# Patient Record
Sex: Female | Born: 1950 | Race: White | Hispanic: No | Marital: Married | Smoking: Never smoker
Health system: Southern US, Community
[De-identification: ages and names within clinical notes are randomized; demographics above are authoritative.]

## PROBLEM LIST (undated history)

## (undated) DIAGNOSIS — I10 Essential (primary) hypertension: Secondary | ICD-10-CM

## (undated) DIAGNOSIS — I493 Ventricular premature depolarization: Secondary | ICD-10-CM

## (undated) DIAGNOSIS — E6609 Other obesity due to excess calories: Secondary | ICD-10-CM

## (undated) DIAGNOSIS — E7800 Pure hypercholesterolemia, unspecified: Secondary | ICD-10-CM

## (undated) HISTORY — PX: BREAST EXCISIONAL BIOPSY: SUR124

## (undated) HISTORY — PX: OTHER SURGICAL HISTORY: SHX169

## (undated) HISTORY — DX: Pure hypercholesterolemia, unspecified: E78.00

## (undated) HISTORY — DX: Essential (primary) hypertension: I10

## (undated) HISTORY — DX: Other obesity due to excess calories: E66.09

## (undated) HISTORY — DX: Ventricular premature depolarization: I49.3

---

## 1999-12-01 ENCOUNTER — Encounter: Admission: RE | Admit: 1999-12-01 | Discharge: 1999-12-01 | Payer: Self-pay | Admitting: Family Medicine

## 1999-12-01 ENCOUNTER — Encounter: Payer: Self-pay | Admitting: Family Medicine

## 2002-06-24 ENCOUNTER — Encounter: Admission: RE | Admit: 2002-06-24 | Discharge: 2002-06-24 | Payer: Self-pay | Admitting: Cardiology

## 2002-06-24 ENCOUNTER — Encounter: Payer: Self-pay | Admitting: Cardiology

## 2002-10-24 ENCOUNTER — Emergency Department (HOSPITAL_COMMUNITY): Admission: EM | Admit: 2002-10-24 | Discharge: 2002-10-24 | Payer: Self-pay | Admitting: Emergency Medicine

## 2003-01-12 ENCOUNTER — Emergency Department (HOSPITAL_COMMUNITY): Admission: EM | Admit: 2003-01-12 | Discharge: 2003-01-12 | Payer: Self-pay | Admitting: Emergency Medicine

## 2003-01-12 ENCOUNTER — Encounter: Payer: Self-pay | Admitting: Emergency Medicine

## 2003-08-19 ENCOUNTER — Emergency Department (HOSPITAL_COMMUNITY): Admission: EM | Admit: 2003-08-19 | Discharge: 2003-08-19 | Payer: Self-pay | Admitting: Internal Medicine

## 2003-11-14 ENCOUNTER — Encounter: Admission: RE | Admit: 2003-11-14 | Discharge: 2003-11-14 | Payer: Self-pay | Admitting: Family Medicine

## 2006-04-28 ENCOUNTER — Other Ambulatory Visit: Admission: RE | Admit: 2006-04-28 | Discharge: 2006-04-28 | Payer: Self-pay | Admitting: Gynecology

## 2006-08-04 ENCOUNTER — Encounter: Admission: RE | Admit: 2006-08-04 | Discharge: 2006-08-04 | Payer: Self-pay | Admitting: Gynecology

## 2008-07-26 ENCOUNTER — Emergency Department (HOSPITAL_COMMUNITY): Admission: EM | Admit: 2008-07-26 | Discharge: 2008-07-26 | Payer: Self-pay | Admitting: Emergency Medicine

## 2009-03-27 ENCOUNTER — Ambulatory Visit: Payer: Self-pay | Admitting: Gynecology

## 2009-05-08 ENCOUNTER — Encounter: Admission: RE | Admit: 2009-05-08 | Discharge: 2009-05-08 | Payer: Self-pay | Admitting: Cardiology

## 2010-04-25 ENCOUNTER — Encounter: Admission: RE | Admit: 2010-04-25 | Discharge: 2010-04-25 | Payer: Self-pay | Admitting: Family Medicine

## 2010-04-26 ENCOUNTER — Encounter: Admission: RE | Admit: 2010-04-26 | Discharge: 2010-04-26 | Payer: Self-pay | Admitting: Family Medicine

## 2010-04-29 ENCOUNTER — Encounter: Admission: RE | Admit: 2010-04-29 | Discharge: 2010-04-29 | Payer: Self-pay | Admitting: Family Medicine

## 2010-06-18 ENCOUNTER — Encounter: Admission: RE | Admit: 2010-06-18 | Discharge: 2010-06-18 | Payer: Self-pay | Admitting: Cardiology

## 2010-10-05 ENCOUNTER — Ambulatory Visit
Admission: RE | Admit: 2010-10-05 | Discharge: 2010-10-05 | Payer: Self-pay | Source: Home / Self Care | Attending: Gynecology | Admitting: Gynecology

## 2010-10-05 ENCOUNTER — Other Ambulatory Visit
Admission: RE | Admit: 2010-10-05 | Discharge: 2010-10-05 | Payer: Self-pay | Source: Home / Self Care | Admitting: Gynecology

## 2010-10-15 ENCOUNTER — Ambulatory Visit
Admission: RE | Admit: 2010-10-15 | Discharge: 2010-10-15 | Payer: Self-pay | Source: Home / Self Care | Attending: Gynecology | Admitting: Gynecology

## 2010-10-17 ENCOUNTER — Encounter: Payer: Self-pay | Admitting: Family Medicine

## 2010-10-22 ENCOUNTER — Other Ambulatory Visit: Payer: Self-pay | Admitting: Gynecology

## 2010-10-22 DIAGNOSIS — N6310 Unspecified lump in the right breast, unspecified quadrant: Secondary | ICD-10-CM

## 2010-11-12 ENCOUNTER — Ambulatory Visit
Admission: RE | Admit: 2010-11-12 | Discharge: 2010-11-12 | Disposition: A | Discharge status: Home / Self Care | Payer: BC Managed Care – PPO | Source: Ambulatory Visit | Attending: Gynecology | Admitting: Gynecology

## 2010-11-12 DIAGNOSIS — N6310 Unspecified lump in the right breast, unspecified quadrant: Secondary | ICD-10-CM

## 2010-12-23 ENCOUNTER — Telehealth: Payer: Self-pay | Admitting: Cardiology

## 2010-12-23 NOTE — Telephone Encounter (Signed)
PT'S HUSBAND CALLED, PT'S TOPROLOL HAS NO MORE REFILLS. HE IS WANTING TO GO TO THE PHARMACY ASAP. SHE IS DOWN TO ONE PILL.

## 2010-12-24 ENCOUNTER — Other Ambulatory Visit: Payer: Self-pay | Admitting: *Deleted

## 2010-12-24 DIAGNOSIS — I10 Essential (primary) hypertension: Secondary | ICD-10-CM

## 2010-12-24 MED ORDER — AMLODIPINE BESYLATE 5 MG PO TABS: 5.0000 mg | ORAL_TABLET | Freq: Every day | ORAL | Status: DC

## 2010-12-24 MED ORDER — METOPROLOL SUCCINATE ER 100 MG PO TB24: ORAL_TABLET | ORAL | Status: DC

## 2010-12-24 NOTE — Telephone Encounter (Signed)
Refilled meds per fax request.  

## 2011-01-14 ENCOUNTER — Encounter: Payer: Self-pay | Admitting: Cardiology

## 2011-01-14 ENCOUNTER — Other Ambulatory Visit: Payer: Self-pay | Admitting: *Deleted

## 2011-01-14 DIAGNOSIS — E6609 Other obesity due to excess calories: Secondary | ICD-10-CM | POA: Insufficient documentation

## 2011-01-14 DIAGNOSIS — E785 Hyperlipidemia, unspecified: Secondary | ICD-10-CM | POA: Insufficient documentation

## 2011-01-14 DIAGNOSIS — E7800 Pure hypercholesterolemia, unspecified: Secondary | ICD-10-CM

## 2011-01-14 DIAGNOSIS — I493 Ventricular premature depolarization: Secondary | ICD-10-CM

## 2011-01-14 DIAGNOSIS — I10 Essential (primary) hypertension: Secondary | ICD-10-CM | POA: Insufficient documentation

## 2011-01-18 ENCOUNTER — Other Ambulatory Visit: Payer: BC Managed Care – PPO | Admitting: *Deleted

## 2011-01-18 ENCOUNTER — Ambulatory Visit: Payer: BC Managed Care – PPO | Admitting: Cardiology

## 2011-03-09 ENCOUNTER — Other Ambulatory Visit (INDEPENDENT_AMBULATORY_CARE_PROVIDER_SITE_OTHER): Payer: BC Managed Care – PPO | Admitting: *Deleted

## 2011-03-09 ENCOUNTER — Ambulatory Visit (INDEPENDENT_AMBULATORY_CARE_PROVIDER_SITE_OTHER): Payer: BC Managed Care – PPO | Admitting: Cardiology

## 2011-03-09 ENCOUNTER — Other Ambulatory Visit: Payer: Self-pay | Admitting: Cardiology

## 2011-03-09 ENCOUNTER — Encounter: Payer: Self-pay | Admitting: Cardiology

## 2011-03-09 DIAGNOSIS — E7800 Pure hypercholesterolemia, unspecified: Secondary | ICD-10-CM

## 2011-03-09 DIAGNOSIS — I10 Essential (primary) hypertension: Secondary | ICD-10-CM

## 2011-03-09 LAB — BASIC METABOLIC PANEL
BUN: 17 mg/dL (ref 6–23)
CO2: 29 mEq/L (ref 19–32)
Calcium: 8.9 mg/dL (ref 8.4–10.5)
GFR: 108.29 mL/min (ref >60.00)
Sodium: 142 mEq/L (ref 135–145)

## 2011-03-09 LAB — HEPATIC FUNCTION PANEL
Alkaline Phosphatase: 63 U/L (ref 39–117)
Total Bilirubin: 0.9 mg/dL (ref 0.3–1.2)
Total Protein: 7.3 g/dL (ref 6.0–8.3)

## 2011-03-09 LAB — LIPID PANEL: Total CHOL/HDL Ratio: 5

## 2011-03-09 NOTE — Progress Notes (Signed)
[name] Date of Birth:  Jan 24, 1951 [facility] [address]   [address] [place], [place]  [address] [phone]           Fax   [phone]  History of Present Illness: This pleasant 60 year old woman is seen for a followup office visit.  We last saw her in July 2010 to at that time she weighed 224 pounds.Her present weight of 217 is an improvement.  She has not been getting any regular exercise.  She has been having a lot of problems with arthritis.  She is unable to take any of the nonsteroidal anti-inflammatories because they cause her hemorrhoids to bleed.  He is able to take Tylenol.  Patient has occasional chest discomfort and occasional PVCs.  She had a normal nuclear stress test 08/08/07.  Current Outpatient Prescriptions  Medication Sig Dispense Refill  . Acetaminophen (TYLENOL PO) Take 100 mg by mouth. Taking 2 daily       . amLODipine (NORVASC) 5 MG tablet Take 1 tablet (5 mg total) by mouth daily.  30 tablet  1  . doxycycline (DORYX) 100 MG DR capsule Take up to 3 daily       . metoprolol (TOPROL XL) 100 MG 24 hr tablet 1/2 bid  45 tablet  1  . Multiple Vitamin (MULTIVITAMIN) tablet Take 1 tablet by mouth daily.        Marland Kitchen DISCONTD: MetroNIDAZOLE (FLAGYL PO) Take by mouth 2 (two) times daily.          Allergies  Allergen Reactions  . Amoxicillin     itching  . Diovan (Valsartan)     weakness  . Hctz (Hydrochlorothiazide)     weakness  . Penicillins   . Zithromax (Azithromycin)     Patient Active Problem List  Diagnoses  . PVC's (premature ventricular contractions)  . HTN (hypertension)  . Exogenous obesity  . Hypercholesteremia    History  Smoking status  . Never Smoker   Smokeless tobacco  . Not on file    History  Alcohol Use No    Family History  Problem Relation Age of Onset  . Hypertension    . Coronary artery disease    . Diabetes      Review of Systems: Constitutional: no fever chills diaphoresis or  fatigue or change in weight.  Head and neck: no hearing loss, no epistaxis, no photophobia or visual disturbance. Respiratory: No cough, shortness of breath or wheezing. Cardiovascular: No chest pain peripheral edema, palpitations. Gastrointestinal: No abdominal distention, no abdominal pain, no change in bowel habits hematochezia or melena. Genitourinary: No dysuria, no frequency, no urgency, no nocturia. Musculoskeletal:Significant for lots of arthritis and bone cysts. Neurological: No dizziness, no headaches, no numbness, no seizures, no syncope, no weakness, no tremors. Hematologic: No lymphadenopathy, no easy bruising. Psychiatric: No confusion, no hallucinations, no sleep disturbance.    Physical Exam: Filed Vitals:   03/09/11 0902  BP: 150/90  Pulse: 68  The general appearance reveals a well-developed well-nourished woman in no distress.The head and neck exam reveals pupils equal and reactive.  Extraocular movements are full.  There is no scleral icterus.  The mouth and pharynx are normal.  The neck is supple.  The carotids reveal no bruits.  The jugular venous pressure is normal.  The  thyroid is not enlarged.  There is no lymphadenopathy.  The chest is clear to percussion and auscultation.  There are no rales or rhonchi.  Expansion of the  chest is symmetrical.  The precordium is quiet.  The first heart sound is normal.  The second heart sound is physiologically split.  There is no murmur gallop rub or click.  There is no abnormal lift or heave.The breasts reveal no masses.  The abdomen is soft and nontender.  The bowel sounds are normal.  The liver and spleen are not enlarged.  There are no abdominal masses.  There are no abdominal bruits.  Extremities reveal good pedal pulses.  There is no phlebitis or edema.  There is no cyanosis or clubbing.  Strength is normal and symmetrical in all extremities.  There is no lateralizing weakness.  There are no sensory deficits.  The skin is warm and  dry.  There is no rash.   Assessment / Plan: Await results of today's blood work then consider statin therapy.  Continue weight loss program.  Recheck in6 months over the Christmas holiday preferably for followup office visit and lab work.  She will monitor her blood pressure at home and if it remains elevated she will contact us.

## 2011-03-09 NOTE — Assessment & Plan Note (Signed)
The patient is seen today after a two-year absence.  She has a past history of essential hypertension.  He has not been expressing any headaches or dizziness she denies any exertional chest pain.  She does not have any history of ischemic heart disease and she had a normal nuclear stress test 08/08/07.  She does acknowledge that she gets nervous driving to [facility] in the heavy traffic and it makes her blood pressure go up.

## 2011-03-09 NOTE — Assessment & Plan Note (Addendum)
Patient has a past history of hypercholesterolemia.  Since last visit she has Lost weight.  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  Blood work drawn today is pending.  Consider statin therapy if lipids remain elevated.

## 2011-03-10 NOTE — Progress Notes (Signed)
Lm for patient to call

## 2011-03-22 IMAGING — US US BREAST R
1 series · 4 of 4 positions shown · non-contrast
Comparison: Prior studies

CLINICAL DATA: The patient noted a small superficial palpable
nodule located medially and inferiorly within the right breast in a
parasternal location.  A small amount of material drained from this
lesion and this decreased in size.

DIGITAL DIAGNOSTIC RIGHT BREAST MAMMOGRAM WITH CAD AND RIGHT BREAST
ULTRASOUND:

[Series 1: us breast right · 4 of 4 slices shown]
[im 1/4]
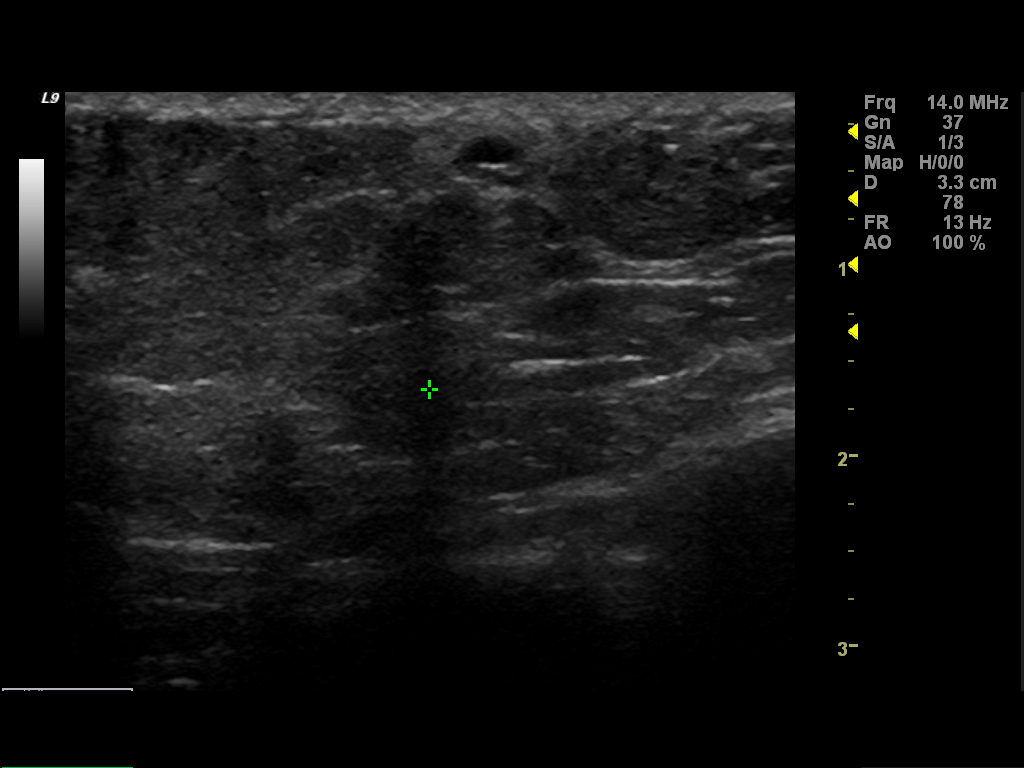
[im 2/4]
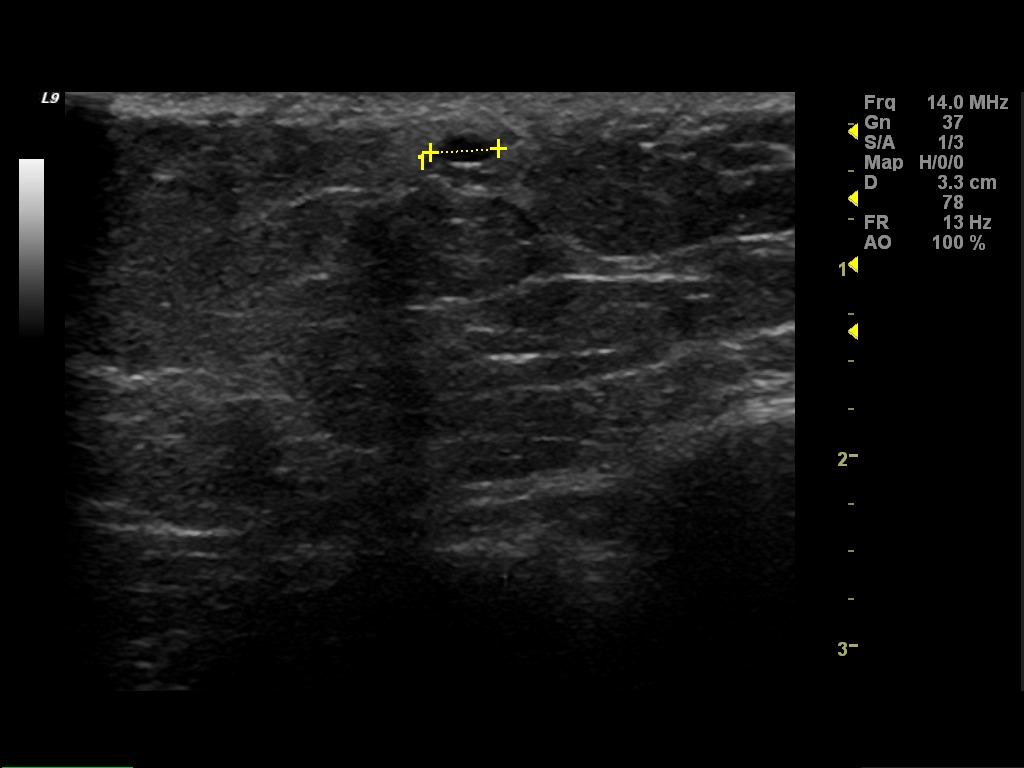
[im 3/4]
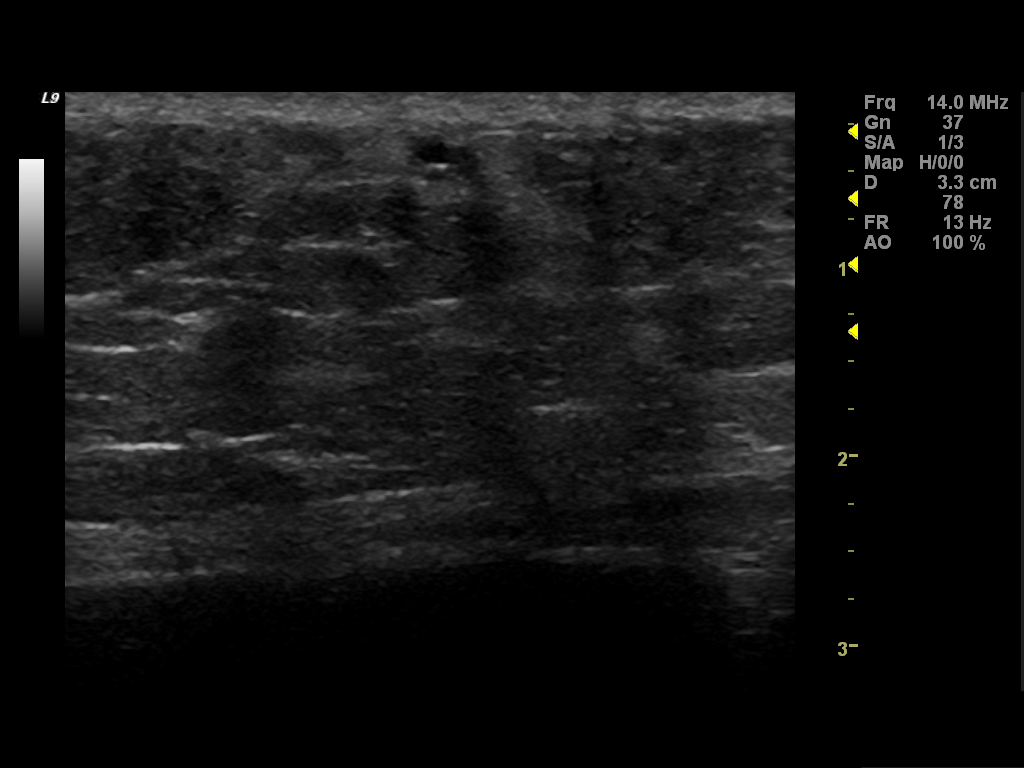
[im 4/4]
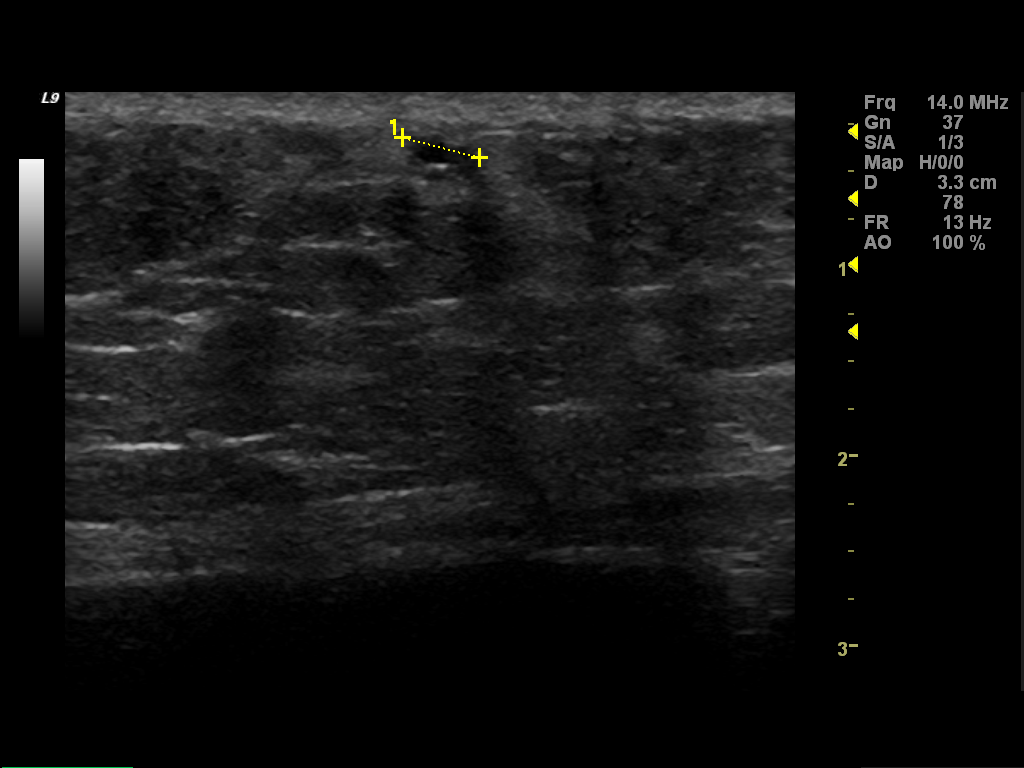

[4 of 4 positions shown; findings below may reference images not displayed]

FINDINGS: There is a fibrofatty parenchymal pattern present which
is stable.  In the area of questionable palpable nodularity, there
is predominately fatty tissue.  There is no mass or distortion.
Mammographic images were processed with CAD.

On physical exam, there is no discrete palpable abnormality present
on today's evaluation.

Ultrasound is performed, showing a small subdermal cyst located
within the right breast at the 4 o'clock position in a parasternal
location.  This measures 4 mm in size.  Most likely this represents
a sebaceous cyst which has spontaneously decompressed.  There is no
mass, distortion, or worrisome shadowing.
IMPRESSION: A small (4 mm) probable sebaceous cyst located within the right
breast at the 4 o'clock position in a parasternal location.  No
findings worrisome for malignancy.  Recommend screening mammography
in May 2011.

BI-RADS CATEGORY 2:  Benign finding(s).

## 2011-03-25 ENCOUNTER — Telehealth: Payer: Self-pay | Admitting: *Deleted

## 2011-03-25 DIAGNOSIS — I10 Essential (primary) hypertension: Secondary | ICD-10-CM

## 2011-03-25 MED ORDER — METOPROLOL SUCCINATE ER 100 MG PO TB24: ORAL_TABLET | ORAL | Status: DC

## 2011-03-25 MED ORDER — AMLODIPINE BESYLATE 5 MG PO TABS: 5.0000 mg | ORAL_TABLET | Freq: Every day | ORAL | Status: DC

## 2011-03-25 NOTE — Progress Notes (Signed)
Refuses to start any statins

## 2011-03-25 NOTE — Telephone Encounter (Signed)
Patient phone back regarding lab results.  Patient refuses to start any statin drugs.  Explained that this will put her at higher risk, still refuses.

## 2011-03-25 NOTE — Telephone Encounter (Signed)
Message copied by Burnell Blanks on Fri Mar 25, 2011 10:43 AM ------      Message from: Cassell Clement      Created: Thu Mar 10, 2011  9:30 AM       Please report.  Her lipids are still elevated.  The LDL is 158.The total cholesterol is 219 and triglycerides are 176 with HDL of 43.The kidney and liver function tests are normal.I would like her to add a statin in the form of Crestor 10 mg daily.  Give her samples or coupons if available.  Return for fasting lipid panel and hepatic function panel and basal metabolic panel in 2 months

## 2011-03-25 NOTE — Telephone Encounter (Signed)
Noted. TAB

## 2011-07-07 ENCOUNTER — Encounter (HOSPITAL_COMMUNITY): Payer: Self-pay | Admitting: Radiology

## 2011-07-07 ENCOUNTER — Emergency Department (HOSPITAL_COMMUNITY): Payer: BC Managed Care – PPO

## 2011-07-07 ENCOUNTER — Inpatient Hospital Stay (HOSPITAL_COMMUNITY)
Admission: EM | Admit: 2011-07-07 | Discharge: 2011-07-12 | DRG: 157 | Disposition: A | Discharge status: Home / Self Care | Payer: BC Managed Care – PPO | Attending: Surgery | Admitting: Surgery

## 2011-07-07 DIAGNOSIS — I10 Essential (primary) hypertension: Secondary | ICD-10-CM | POA: Diagnosis present

## 2011-07-07 DIAGNOSIS — Q430 Meckel's diverticulum (displaced) (hypertrophic): Principal | ICD-10-CM

## 2011-07-07 DIAGNOSIS — Z886 Allergy status to analgesic agent status: Secondary | ICD-10-CM

## 2011-07-07 DIAGNOSIS — E669 Obesity, unspecified: Secondary | ICD-10-CM | POA: Diagnosis present

## 2011-07-07 DIAGNOSIS — Z880 Allergy status to penicillin: Secondary | ICD-10-CM

## 2011-07-07 DIAGNOSIS — K630 Abscess of intestine: Secondary | ICD-10-CM | POA: Diagnosis present

## 2011-07-07 DIAGNOSIS — Z888 Allergy status to other drugs, medicaments and biological substances status: Secondary | ICD-10-CM

## 2011-07-07 LAB — COMPREHENSIVE METABOLIC PANEL
ALT: 14 U/L (ref 0–35)
BUN: 13 mg/dL (ref 6–23)
CO2: 28 mEq/L (ref 19–32)
Creatinine, Ser: 0.54 mg/dL (ref 0.50–1.10)
Potassium: 3.2 mEq/L — ABNORMAL LOW (ref 3.5–5.1)
Sodium: 137 mEq/L (ref 135–145)
Total Protein: 7 g/dL (ref 6.0–8.3)

## 2011-07-07 LAB — CBC
HCT: 41.1 % (ref 36.0–46.0)
MCH: 29.4 pg (ref 26.0–34.0)
MCHC: 33.6 g/dL (ref 30.0–36.0)
RBC: 4.7 MIL/uL (ref 3.87–5.11)
RDW: 12.6 % (ref 11.5–15.5)
WBC: 12.9 10*3/uL — ABNORMAL HIGH (ref 4.0–10.5)

## 2011-07-07 LAB — DIFFERENTIAL
Basophils Absolute: 0 10*3/uL (ref 0.0–0.1)
Eosinophils Absolute: 0.1 10*3/uL (ref 0.0–0.7)
Eosinophils Relative: 1 % (ref 0–5)
Lymphocytes Relative: 19 % (ref 12–46)
Monocytes Absolute: 1 10*3/uL (ref 0.1–1.0)
Monocytes Relative: 8 % (ref 3–12)
Neutro Abs: 9.3 10*3/uL — ABNORMAL HIGH (ref 1.7–7.7)

## 2011-07-07 LAB — URINE MICROSCOPIC-ADD ON

## 2011-07-07 LAB — URINALYSIS, ROUTINE W REFLEX MICROSCOPIC
Glucose, UA: NEGATIVE mg/dL
Urobilinogen, UA: 0.2 mg/dL (ref 0.0–1.0)

## 2011-07-07 MED ORDER — IOHEXOL 300 MG/ML  SOLN
100.0000 mL | Freq: Once | INTRAMUSCULAR | Status: AC | PRN
  Administered 2011-07-07: 100 mL via INTRAVENOUS

## 2011-07-08 ENCOUNTER — Other Ambulatory Visit (INDEPENDENT_AMBULATORY_CARE_PROVIDER_SITE_OTHER): Payer: Self-pay | Admitting: General Surgery

## 2011-07-08 DIAGNOSIS — K631 Perforation of intestine (nontraumatic): Secondary | ICD-10-CM

## 2011-07-08 DIAGNOSIS — Q430 Meckel's diverticulum (displaced) (hypertrophic): Secondary | ICD-10-CM

## 2011-07-08 HISTORY — PX: COLON SURGERY: SHX602

## 2011-07-08 HISTORY — PX: OTHER SURGICAL HISTORY: SHX169

## 2011-07-08 LAB — URINE CULTURE
Culture  Setup Time: 201210120131
Culture: NO GROWTH

## 2011-07-09 LAB — CBC
HCT: 37.2 % (ref 36.0–46.0)
MCHC: 33.3 g/dL (ref 30.0–36.0)
RDW: 12.6 % (ref 11.5–15.5)

## 2011-07-12 NOTE — Op Note (Signed)
[name], [name]                 ACCOUNT NO.:  [id number]  MEDICAL RECORD NO.:  [id number]  LOCATION:  5020                         FACILITY:  [facility]  PHYSICIAN:  Gabrielle Dare. Janee Morn, M.D.DATE OF BIRTH:  1950-11-23  DATE OF PROCEDURE:  07/08/2011 DATE OF DISCHARGE:                              OPERATIVE REPORT   PREOPERATIVE DIAGNOSIS:  Small bowel perforation with intraabdominal abscess.  POSTOPERATIVE DIAGNOSIS:  Perforated Meckel diverticulum with intraabdominal abscess.  PROCEDURE:  Laparoscopic resection of perforated Meckel diverticulum and drainage of intraabdominal abscess.  SURGEON:  Gabrielle Dare. Janee Morn, MD  ANESTHESIA:  General endotracheal.  HISTORY OF PRESENT ILLNESS:  [name] is a 60 year old white female with history of hypertension who developed acute onset of very severe abdominal pain 48 hours ago.  She continued without pain until the following day when she saw her primary care physician at [facility], they did some x-rays.  The patient was called back earlier the following day on July 07, 2011, and referred to the emergency department.  CT scan of the abdomen and pelvis was done at that time demonstrating a perforated small bowel with thickening consistent with either a primary perforation of the small bowel or perforated Meckel diverticulum.  The patient has significant tenderness and she was taken emergently to the operating room.  PROCEDURE IN DETAIL:  Informed consent was obtained.  The patient was identified in the preop holding area.  She was received intravenous antibiotics.  She was brought to the operating room.  General endotracheal anesthesia was administered by the Anesthesia staff. Nursing staff placed a Foley catheter.  A time-out procedure was done. The abdomen was prepped and draped in sterile fashion.  The infraumbilical region was infiltrated with 0.25% Marcaine with epinephrine.  Infraumbilical incision was made.   Subcutaneous tissues were dissected down revealing the anterior fascia.  This was divided sharply along the midline and the peritoneal cavity was entered under direct vision without difficulty.  A 0 Vicryl pursestring suture was placed around the fascial opening.  The Hasson trocar was inserted into the abdomen.  The abdomen was insufflated with carbon dioxide in standard fashion.  Under direct vision, a 5-mm right midabdomen port and a 5-mm left midabdomen port were placed.  Local anesthetic was used at each port site.  The laparoscopic exploration began at the cecum, the appendix was normal.  There was minimal cloudy fluid in the abdomen. The small bowel was run back for several centimeters finally reaching back to an area of inflammation almost just beneath the umbilicus. Careful dissection revealed that this was a perforated Meckel diverticulum that was walled off by another piece of small bowel and some mesentery trapping as a little localized abscess, this was all gently teased apart fully exposing and freeing up the Meckel diverticulum.  The tip of it was intact and not inflamed.  The just past the base and the half of the body was extremely inflamed with one small perforation.  There was a small rim of viable tissue along the connection to the ileum.  Once this was totally freed up, an Endo-GIA with reticulating stapler with a double row of 3 alliance with staples,  found perfused.  The Meckel diverticulum was pulled up and this was fired across the base going down slightly on the small bowel to ensure excellent closure.  The diverticulum was placed in an EndoCatch bag and removed from the abdomen via the infraumbilical port site.  The staple line was then closely inspected and watched.  There tiny bit of bleeding that was cauterized.  Staple line was completely intact.  The small bowel was milked prograde and retrograde.  There was no leakage, then the area appeared viable.  The  abdomen was then copiously irrigated with multiple liters of warm saline.  The irrigation fluid returned clear. Staple line was rechecked and remained completely intact.  The area where the abscess had been was then thoroughly irrigated out.  A 19- [place] Blake drain was placed down and had little abscess of that area and brought out through the left lower quadrant port site and secured with nylon suture.  The remainder of the irrigation fluid was evacuated and it was clear.  The two remaining ports were removed under direct vision, and the pneumoperitoneum was released.  The infraumbilical fascia was closed by tying the 0 Vicryl purse-string with care not to trap any intraabdominal contents.  The two wounds were copiously irrigated and skin of each was closed with running 4-0 Monocryl subcuticular stitch followed by Dermabond.  All sponge, needle and instrument counts were correct.  The patient tolerated the procedure well without apparent complication and was taken the recovery room in stable condition.     Gabrielle Dare Janee Morn, M.D.     BET/MEDQ  D:  07/08/2011  T:  07/08/2011  Job:  [id number]  cc:   Cassell Clement, M.D.  Electronically Signed by Violeta Gelinas M.D. on 07/12/2011 01:32:12 PM

## 2011-07-18 ENCOUNTER — Encounter (INDEPENDENT_AMBULATORY_CARE_PROVIDER_SITE_OTHER): Payer: Self-pay

## 2011-07-20 ENCOUNTER — Ambulatory Visit (INDEPENDENT_AMBULATORY_CARE_PROVIDER_SITE_OTHER): Payer: BC Managed Care – PPO | Admitting: General Surgery

## 2011-07-20 ENCOUNTER — Encounter (INDEPENDENT_AMBULATORY_CARE_PROVIDER_SITE_OTHER): Payer: Self-pay | Admitting: General Surgery

## 2011-07-20 VITALS — BP 136/84 | HR 72 | Temp 97.4°F | Resp 20 | Ht 67.5 in | Wt 213.5 lb

## 2011-07-20 DIAGNOSIS — Q430 Meckel's diverticulum (displaced) (hypertrophic): Secondary | ICD-10-CM

## 2011-07-20 NOTE — Progress Notes (Signed)
Subjective:     Patient ID: [name], female   DOB: 1951/08/25, 60 y.o.   MRN: [id number]  HPI Patient presents status post laparoscopic resection of perforated Meckel's diverticulum with intra-abdominal abscess fluid this was done on July 08, 2011. She was discharged home with a JP drain. The output has gradually tapered down to less than 30 cc per day. She is eating and moving her bowels. She is not taking any pain medication.  Review of Systems     Objective:   Physical Exam    Abdomen soft and nontender. Incisions are clean dry and intact. Bowel sounds are active.  JP. drain was removed without difficulty. A small gauze was placed at that site. Assessment:    Doing well status post laparoscopic resection of perforated Meckel's diverticulum and drainage of intra-abdominal abscess    Plan:     Upon heavy lifting for a total of 6 weeks after surgery. She is cleared to return to work starting September 05, 2011. Wound care instructions were given. We'll see her back as needed.She will finish her antibiotics from hospital.

## 2011-07-26 NOTE — Discharge Summary (Signed)
  [name], [name]                 ACCOUNT NO.:  [id number]  MEDICAL RECORD NO.:  [id number]  LOCATION:  5020                         FACILITY:  [facility]  PHYSICIAN:  Velora Heckler, MD      DATE OF BIRTH:  Nov 22, 1950  DATE OF ADMISSION:  07/07/2011 DATE OF DISCHARGE:  07/12/2011                              DISCHARGE SUMMARY   DISCHARGE DIAGNOSES: 1. Perforated Meckel diverticulum with abscess formation. 2. Hypertension. 3. Obesity.  CONSULTANTS:  None.  PROCEDURES:  Laparoscopic resection of perforated Meckel diverticulum with drainage of abdominal abscess by Dr. Janee Morn.  HISTORY OF PRESENT ILLNESS:  This is a 60 year old white female who came in complaining of increasing abdominal pain over 48 hours.  She was seen at [facility] and x-ray was taken.  She was directed to the emergency department where a CT showed a small bowel perforation with abscess.  She was taken to the operating room for the above- mentioned surgery and then transferred to the floor for further care.  HOSPITAL COURSE:  The patient did pretty well in the hospital.  Her ileus resolved quickly and her diet was able to be advanced.  She had had a couple of bowel movements and was tolerating regular diet prior to Discharge.  Her pain was controlled mostly on Tylenol.  She was mobilizing well and was able to be discharged in good condition in the care of her husband.  Her drain continued to put out a fair amount of fluid and so was left in at the time of discharge.  DISCHARGE MEDICATIONS: 1. Tylenol 650 mg p.o. q.4 hours p.r.n. pain. 2. Norco 5/325 take 1 p.o. q.4 hours p.r.n. more severe pain #20 with     no refill. 3. Cipro 500 mg take 1 by mouth twice daily, #12 no refill. 4. Metronidazole 500 mg to take 1 by mouth every 8 hours, #24 with no     refill.  This will give her a full 10 day course on her     antibiotics.  In addition, she should resume her Norvasc 5 mg by     mouth daily at  bedtime and Toprol-XL 50 mg by mouth twice daily.     She should stop the doxycycline that she was on.  She has had a     here to the new schedule for the Tylenol.  FOLLOWUP:  The patient will need to follow up with Dr. Janee Morn in the clinic and we will call the office for an appointment.     [name], P.A.   ______________________________ Velora Heckler, MD    MJ/MEDQ  D:  07/12/2011  T:  07/12/2011  Job:  [id number]  Electronically Signed by Charma Igo P.A. on 07/18/2011 02:15:36 PM Electronically Signed by Darnell Level MD on 07/26/2011 11:08:51 AM

## 2011-10-10 ENCOUNTER — Telehealth: Payer: Self-pay | Admitting: Cardiology

## 2011-10-10 ENCOUNTER — Encounter: Payer: Self-pay | Admitting: Cardiology

## 2011-10-10 NOTE — Telephone Encounter (Signed)
New Problem:    Patient called in today wanting to speak with you because she claims that something came up at her job and she needs a letter saying that she is physically unable to work.  Please call back after 4pm.

## 2011-10-10 NOTE — Telephone Encounter (Signed)
Has to be able to physically lift at least 50 lbs and she can not do it.  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 Will forward to  Dr. Patty Sermons for reveiw  Fax [phone] attn: Jillene Bucks

## 2011-10-10 NOTE — Telephone Encounter (Signed)
Pt calling back re message left earlier, requesting rtn call back today

## 2011-10-11 ENCOUNTER — Encounter: Payer: Self-pay | Admitting: *Deleted

## 2011-10-11 NOTE — Telephone Encounter (Signed)
Advised will mail letter

## 2012-01-26 ENCOUNTER — Telehealth (INDEPENDENT_AMBULATORY_CARE_PROVIDER_SITE_OTHER): Payer: Self-pay

## 2012-01-26 ENCOUNTER — Encounter (INDEPENDENT_AMBULATORY_CARE_PROVIDER_SITE_OTHER): Payer: BC Managed Care – PPO | Admitting: General Surgery

## 2012-01-26 NOTE — Telephone Encounter (Signed)
Patient spouse called in to report his wife has been experiencing epigastric pain, urine has a foul odor, increase in gas.  Patient denies fevers, nausea or vomiting, bm's are regular, patient has appetite.  [name] reports this has been going on for atleast 2 weeks.  Dr. Janee Morn paged for review.

## 2012-01-26 NOTE — Telephone Encounter (Signed)
Reviewed with Dr. Janee Morn--  patient need's to follow up with her PCP for further evaluation of her urine odor and her GI physician for follow up and further workup.  It does not seem to be a surgical issue at this time.  Mr. Holte will call and schedule appointments with PCP & GI, call our office if he has questions or concerns.

## 2012-04-06 ENCOUNTER — Ambulatory Visit (INDEPENDENT_AMBULATORY_CARE_PROVIDER_SITE_OTHER): Payer: BC Managed Care – PPO | Admitting: Cardiology

## 2012-04-06 ENCOUNTER — Encounter: Payer: Self-pay | Admitting: Cardiology

## 2012-04-06 VITALS — BP 148/90 | HR 80 | Ht 67.0 in | Wt 223.0 lb

## 2012-04-06 DIAGNOSIS — I493 Ventricular premature depolarization: Secondary | ICD-10-CM

## 2012-04-06 DIAGNOSIS — R5381 Other malaise: Secondary | ICD-10-CM

## 2012-04-06 DIAGNOSIS — I10 Essential (primary) hypertension: Secondary | ICD-10-CM

## 2012-04-06 DIAGNOSIS — R5383 Other fatigue: Secondary | ICD-10-CM | POA: Insufficient documentation

## 2012-04-06 DIAGNOSIS — I4949 Other premature depolarization: Secondary | ICD-10-CM

## 2012-04-06 MED ORDER — AMLODIPINE BESYLATE 10 MG PO TABS: 10.0000 mg | ORAL_TABLET | Freq: Every day | ORAL | Status: DC

## 2012-04-06 NOTE — Assessment & Plan Note (Signed)
Patient continues to complain of lack of energy.  This has been a chronic problem with her.  She has been checked for thyroid and for anemia etc. and nothing has turned up.  Patient has gained 6 pounds since last visit.  Her overweight status may be significant contributory factor to her lack of energy and we talked about that today.  She is going to try to avoid carbohydrates and starches and white things.

## 2012-04-06 NOTE — Assessment & Plan Note (Signed)
The patient has a past history of PVCs.  She has not been aware of any PVCs recently.

## 2012-04-06 NOTE — Progress Notes (Signed)
[name] Date of Birth:  28-Jan-1951 [facility] [address] [place], [place]  [address] [phone]         Fax   [phone]  History of Present Illness: This pleasant 61 year old woman is seen after a one-year absence.  She has a past history of essential hypertension and exogenous obesity.  We last saw her in June 2012.  She reports that in October 2012 she developed severe epigastric pain and was hospitalized emergently and underwent emergency surgery by Dr. Violeta Gelinas for a Meckel's diverticulum.  She was hospitalized for 5 days and she was out of work for 2 and half months.  She is still feeling tired and exhausted.  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  Current Outpatient Prescriptions  Medication Sig Dispense Refill  . Acetaminophen (TYLENOL PO) Take 100 mg by mouth. Taking 2 daily       . amLODipine (NORVASC) 10 MG tablet Take 1 tablet (10 mg total) by mouth daily.  90 tablet  3  . metoprolol (TOPROL XL) 100 MG 24 hr tablet 1/2 bid  45 tablet  11  . DISCONTD: amLODipine (NORVASC) 5 MG tablet Take 5 mg by mouth daily.      Marland Kitchen DISCONTD: amLODipine (NORVASC) 5 MG tablet Take 1 tablet (5 mg total) by mouth daily.  30 tablet  11    Allergies  Allergen Reactions  . Amoxicillin     itching  . Diovan (Valsartan)     weakness  . Hctz (Hydrochlorothiazide)     weakness  . Penicillins   . Sulfa Antibiotics     itching  . Zithromax (Azithromycin)     Patient Active Problem List  Diagnosis  . PVC's (premature ventricular contractions)  . HTN (hypertension)  . Exogenous obesity  . Hypercholesteremia    History  Smoking status  . Never Smoker   Smokeless tobacco  . Never Used    History  Alcohol Use No    Family History  Problem  Relation Age of Onset  . Hypertension    . Coronary artery disease    . Diabetes      Review of Systems: Constitutional: no fever chills diaphoresis or fatigue or change in weight.  Head and neck: no hearing loss, no epistaxis, no photophobia or visual disturbance. Respiratory: No cough, shortness of breath or wheezing. Cardiovascular: No chest pain peripheral edema, palpitations. Gastrointestinal: No abdominal distention, no abdominal pain, no change in bowel habits hematochezia or melena. Genitourinary: No dysuria, no frequency, no urgency, no nocturia. Musculoskeletal:No arthralgias, no back pain, no gait disturbance or myalgias. Neurological: No dizziness, no headaches, no numbness, no seizures, no syncope, no weakness, no tremors. Hematologic: No lymphadenopathy, no easy bruising. Psychiatric: No confusion, no hallucinations, no sleep disturbance.    Physical Exam: Filed Vitals:   04/06/12 0947  BP: 148/90  Pulse: 80   dental appearance reveals a well-developed well-nourished woman in no distress.  I rechecked her blood pressure and got 150/90.The head and neck exam reveals pupils equal and reactive.  Extraocular movements are full.  There is no scleral icterus.  The mouth and pharynx are normal.  The neck is supple.  The carotids reveal no bruits.  The jugular venous pressure is normal.  The  thyroid is not enlarged.  There is no lymphadenopathy.  The chest is clear to percussion and auscultation.  There are no rales or rhonchi.  Expansion of the chest is symmetrical.  The precordium is quiet.  The first heart sound is normal.  The second heart sound is physiologically split.  There is no murmur gallop rub or click.  There is no abnormal lift or heave.  The abdomen is soft and nontender.  The bowel sounds are normal.  The liver and spleen are not enlarged.  There are no abdominal masses.  There are no abdominal bruits.  Extremities reveal good pedal pulses.  There is no phlebitis or  edema.  There is no cyanosis or clubbing.  Strength is normal and symmetrical in all extremities.  There is no lateralizing weakness.  There are no sensory deficits.  The skin is warm and dry.  There is no rash.     Assessment / Plan: The patient is to continue same medication except increase amlodipine to 10 mg daily for better blood pressure control.  She needs to work harder on weight loss through dietary means.  Sugar rechecked in 6 months for followup office visit EKG and fasting lipid panel hepatic function panel and basal metabolic panel

## 2012-04-06 NOTE — Assessment & Plan Note (Signed)
The patient has a long history of essential hypertension.  Her blood pressure continues to run high.  She is intolerant of ACE inhibitors and of hydrochlorothiazide.  She is presently on amlodipine and metoprolol.  Her blood pressure remains high in the range of 150/90.

## 2012-04-06 NOTE — Patient Instructions (Addendum)
Increase your Amlodipine to 10 mg daily (double your 5 mg tablet until finished and a new Rx has been sent to [facility] for the 10 mg tablet and you will only take 1 tablet)  Your physician wants you to follow-up in: 6 months with fasting labs (lp/bmet/hfp)  You will receive a reminder letter in the mail two months in advance. If you don't receive a letter, please call our office to schedule the follow-up appointment.

## 2012-04-13 ENCOUNTER — Other Ambulatory Visit: Payer: Self-pay | Admitting: Gynecology

## 2012-04-13 DIAGNOSIS — Z1231 Encounter for screening mammogram for malignant neoplasm of breast: Secondary | ICD-10-CM

## 2012-04-18 ENCOUNTER — Ambulatory Visit
Admission: RE | Admit: 2012-04-18 | Discharge: 2012-04-18 | Disposition: A | Discharge status: Home / Self Care | Payer: BC Managed Care – PPO | Source: Ambulatory Visit | Attending: Gynecology | Admitting: Gynecology

## 2012-04-18 ENCOUNTER — Ambulatory Visit: Payer: BC Managed Care – PPO

## 2012-04-18 DIAGNOSIS — Z1231 Encounter for screening mammogram for malignant neoplasm of breast: Secondary | ICD-10-CM

## 2012-04-23 ENCOUNTER — Other Ambulatory Visit: Payer: Self-pay | Admitting: Cardiology

## 2012-11-28 ENCOUNTER — Other Ambulatory Visit: Payer: Self-pay | Admitting: *Deleted

## 2012-11-28 MED ORDER — METOPROLOL SUCCINATE ER 100 MG PO TB24: ORAL_TABLET | ORAL | Status: DC

## 2012-12-25 ENCOUNTER — Telehealth: Payer: Self-pay

## 2012-12-25 DIAGNOSIS — I10 Essential (primary) hypertension: Secondary | ICD-10-CM

## 2012-12-25 MED ORDER — AMLODIPINE BESYLATE 10 MG PO TABS: 10.0000 mg | ORAL_TABLET | Freq: Every day | ORAL | Status: DC

## 2012-12-25 MED ORDER — METOPROLOL SUCCINATE ER 100 MG PO TB24: ORAL_TABLET | ORAL | Status: DC

## 2012-12-25 NOTE — Telephone Encounter (Signed)
Pt called to rqst refill  For metoprolol and amplodipine. 90 tab R-0. Pt has upcoming appt for 5/14

## 2013-02-01 ENCOUNTER — Ambulatory Visit (INDEPENDENT_AMBULATORY_CARE_PROVIDER_SITE_OTHER): Payer: BC Managed Care – PPO | Admitting: Cardiology

## 2013-02-01 ENCOUNTER — Encounter: Payer: Self-pay | Admitting: Cardiology

## 2013-02-01 VITALS — BP 137/73 | HR 72 | Ht 67.5 in | Wt 226.8 lb

## 2013-02-01 DIAGNOSIS — I10 Essential (primary) hypertension: Secondary | ICD-10-CM

## 2013-02-01 DIAGNOSIS — I119 Hypertensive heart disease without heart failure: Secondary | ICD-10-CM

## 2013-02-01 DIAGNOSIS — I4949 Other premature depolarization: Secondary | ICD-10-CM

## 2013-02-01 DIAGNOSIS — E6609 Other obesity due to excess calories: Secondary | ICD-10-CM

## 2013-02-01 DIAGNOSIS — E669 Obesity, unspecified: Secondary | ICD-10-CM

## 2013-02-01 DIAGNOSIS — I493 Ventricular premature depolarization: Secondary | ICD-10-CM

## 2013-02-01 DIAGNOSIS — E7800 Pure hypercholesterolemia, unspecified: Secondary | ICD-10-CM

## 2013-02-01 LAB — LIPID PANEL
HDL: 35.7 mg/dL — ABNORMAL LOW (ref >39.00)
LDL Cholesterol: 121 mg/dL — ABNORMAL HIGH (ref 0–99)
Total CHOL/HDL Ratio: 5
Triglycerides: 153 mg/dL — ABNORMAL HIGH (ref 0.0–149.0)

## 2013-02-01 LAB — BASIC METABOLIC PANEL WITH GFR
BUN: 13 mg/dL (ref 6–23)
CO2: 28 meq/L (ref 19–32)
Calcium: 9.1 mg/dL (ref 8.4–10.5)
Chloride: 102 meq/L (ref 96–112)
Creatinine, Ser: 0.6 mg/dL (ref 0.4–1.2)
GFR: 105.58 mL/min
Glucose, Bld: 97 mg/dL (ref 70–99)
Potassium: 4.1 meq/L (ref 3.5–5.1)
Sodium: 140 meq/L (ref 135–145)

## 2013-02-01 LAB — HEPATIC FUNCTION PANEL
AST: 21 U/L (ref 0–37)
Albumin: 4.3 g/dL (ref 3.5–5.2)
Total Bilirubin: 0.7 mg/dL (ref 0.3–1.2)

## 2013-02-01 MED ORDER — AMLODIPINE BESYLATE 5 MG PO TABS: 5.0000 mg | ORAL_TABLET | Freq: Every day | ORAL | Status: DC

## 2013-02-01 NOTE — Assessment & Plan Note (Signed)
The patient is only able to tolerate 5 mg a amlodipine.  At the 10 mg dose she experiences too much dizziness

## 2013-02-01 NOTE — Assessment & Plan Note (Signed)
The patient still has occasional awareness of palpitations.  EKG today shows no PVCs

## 2013-02-01 NOTE — Progress Notes (Signed)
[name] Date of Birth:  02-11-1951 [facility] [address] [place], [place]  [address] [phone]         Fax   [phone]  History of Present Illness: This pleasant 62 year old woman is seen after a one-year absence. She has a past history of essential hypertension and exogenous obesity. We last saw her in July 2013.  In October 2012 she developed severe epigastric pain and was hospitalized emergently and underwent emergency surgery by Dr. Violeta Gelinas for a Meckel's diverticulum. She was hospitalized for 5 days and she was out of work for 2 and half months.  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  She anticipates teaching for at least 1 more year.  She has taught for almost 30 years.  Classroom teaching however is becoming more stressful because of poor discipline in a classroom.  Current Outpatient Prescriptions  Medication Sig Dispense Refill  . Acetaminophen (TYLENOL PO) Take 100 mg by mouth. Taking 2 daily       . amLODipine (NORVASC) 5 MG tablet Take 1 tablet (5 mg total) by mouth daily.  90 tablet  0  . metoprolol succinate (TOPROL XL) 100 MG 24 hr tablet Take 1/2 tablet twice a day  90 tablet  0   No current facility-administered medications for this visit.    Allergies  Allergen Reactions  . Amoxicillin     itching  . Diovan (Valsartan)     weakness  . Hctz (Hydrochlorothiazide)     weakness  . Penicillins   . Sulfa Antibiotics     itching  . Zithromax (Azithromycin)     Patient Active Problem List   Diagnosis Date Noted  . Malaise and fatigue 04/06/2012  . PVC's (premature ventricular contractions)   . HTN (hypertension)   . Exogenous obesity   . Hypercholesteremia     History  Smoking status  . Never Smoker   Smokeless tobacco  . Never Used    History  Alcohol Use No    Family History  Problem Relation Age of Onset  . Hypertension    . Coronary artery disease    . Diabetes       Review of Systems: Constitutional: no fever chills diaphoresis or fatigue or change in weight.  Head and neck: no hearing loss, no epistaxis, no photophobia or visual disturbance. Respiratory: No cough, shortness of breath or wheezing. Cardiovascular: No chest pain peripheral edema, palpitations. Gastrointestinal: No abdominal distention, no abdominal pain, no change in bowel habits hematochezia or melena. Genitourinary: No dysuria, no frequency, no urgency, no nocturia. Musculoskeletal:No arthralgias, no back pain, no gait disturbance or myalgias. Neurological: No dizziness, no headaches, no numbness, no seizures, no syncope, no weakness, no tremors. Hematologic: No lymphadenopathy, no easy bruising. Psychiatric: No confusion, no hallucinations, no sleep disturbance.    Physical Exam: Filed Vitals:   02/01/13 1405  BP: 137/73  Pulse: 72   general appearance reveals a well-developed well-nourished woman in no distress.The head and neck exam reveals pupils equal and reactive.  Extraocular movements are full.  There is no scleral icterus.  The mouth and pharynx are normal.  The neck is supple.  The carotids reveal no bruits.  The jugular venous pressure is normal.  The  thyroid is not enlarged.  There is no lymphadenopathy.  The chest is clear to percussion and auscultation.  There are no rales or rhonchi.  Expansion of the chest is symmetrical.  The precordium  is quiet.  The first heart sound is normal.  The second heart sound is physiologically split.  There is no murmur gallop rub or click.  There is no abnormal lift or heave.  The abdomen is soft and nontender.  The bowel sounds are normal.  The liver and spleen are not enlarged.  There are no abdominal masses.  There are no abdominal bruits.  Extremities reveal good pedal pulses.  There is no phlebitis or edema.  There is no cyanosis or clubbing.  Strength is normal and symmetrical in all extremities.  There is no lateralizing weakness.   There are no sensory deficits.  The skin is warm and dry.  There is no rash.  EKG shows normal sinus rhythm and no ischemic changes   Assessment / Plan: Continue same medication.  Continue on amlodipine 5 mg daily.  Recheck in 6 months for followup office visit lipid panel hepatic function panel and basal metabolic panel.

## 2013-02-01 NOTE — Assessment & Plan Note (Signed)
The patient has a history of hypercholesterolemia.  In the past she has declined statin drugs.  Her husband had bad experiences with statins causing muscle aches etc. we are checking lab work today

## 2013-02-01 NOTE — Assessment & Plan Note (Signed)
The patient has intended to lose weight but unfortunately her weight is up 3 pounds since last visit.  I have encouraged her to try to lose weight.

## 2013-02-01 NOTE — Patient Instructions (Signed)
Will obtain labs today and call you with the results (LP/HFP/BMET)  CONTINUE THE NORVASC 5 MG LIKE YOU HAVE BEEN TAKING, NEW RX SENT TO PHARMACY  Your physician wants you to follow-up in: 6 months with fasting labs (lp/bmet/hfp)  You will receive a reminder letter in the mail two months in advance. If you don't receive a letter, please call our office to schedule the follow-up appointment.

## 2013-02-03 NOTE — Progress Notes (Signed)
Quick Note:  Please report to patient. The recent labs are stable. Continue same medication and careful diet. Potassium is normal. Lipids are better. ______

## 2013-03-08 ENCOUNTER — Encounter: Payer: Self-pay | Admitting: Cardiology

## 2013-03-11 ENCOUNTER — Encounter: Payer: Self-pay | Admitting: Cardiology

## 2013-03-26 ENCOUNTER — Other Ambulatory Visit: Payer: Self-pay | Admitting: Cardiology

## 2013-05-07 ENCOUNTER — Other Ambulatory Visit: Payer: Self-pay

## 2013-05-07 DIAGNOSIS — Z1231 Encounter for screening mammogram for malignant neoplasm of breast: Secondary | ICD-10-CM

## 2013-05-16 ENCOUNTER — Ambulatory Visit: Payer: BC Managed Care – PPO

## 2013-06-07 ENCOUNTER — Ambulatory Visit
Admission: RE | Admit: 2013-06-07 | Discharge: 2013-06-07 | Disposition: A | Discharge status: Home / Self Care | Payer: BC Managed Care – PPO | Source: Ambulatory Visit

## 2013-06-07 DIAGNOSIS — Z1231 Encounter for screening mammogram for malignant neoplasm of breast: Secondary | ICD-10-CM

## 2013-09-23 ENCOUNTER — Other Ambulatory Visit: Payer: Self-pay | Admitting: Cardiology

## 2013-12-30 ENCOUNTER — Other Ambulatory Visit: Payer: Self-pay | Admitting: Cardiology

## 2014-04-03 ENCOUNTER — Other Ambulatory Visit: Payer: Self-pay | Admitting: Cardiology

## 2014-06-24 ENCOUNTER — Telehealth: Payer: Self-pay | Admitting: Cardiology

## 2014-06-24 NOTE — Telephone Encounter (Signed)
Patient called ask about blood pressure and lab work from last visit.  Informed patient about her last BP, blood glucose, and cholesterol. Informed patient that it appears she was supposed to come back about a year ago for follow-up labs. Patient st she would look at her schedule and call the office to schedule appointment and lab work.

## 2014-06-24 NOTE — Telephone Encounter (Signed)
New message      Need information regarding her cholesterol

## 2014-07-01 ENCOUNTER — Ambulatory Visit (INDEPENDENT_AMBULATORY_CARE_PROVIDER_SITE_OTHER): Payer: BC Managed Care – PPO | Admitting: Gynecology

## 2014-07-01 ENCOUNTER — Encounter: Payer: Self-pay | Admitting: Gynecology

## 2014-07-01 DIAGNOSIS — L02439 Carbuncle of limb, unspecified: Secondary | ICD-10-CM

## 2014-07-01 DIAGNOSIS — N898 Other specified noninflammatory disorders of vagina: Secondary | ICD-10-CM

## 2014-07-01 DIAGNOSIS — L02429 Furuncle of limb, unspecified: Secondary | ICD-10-CM

## 2014-07-01 DIAGNOSIS — R102 Pelvic and perineal pain: Secondary | ICD-10-CM

## 2014-07-01 LAB — WET PREP FOR TRICH, YEAST, CLUE
CLUE CELLS WET PREP: NONE SEEN
TRICH WET PREP: NONE SEEN

## 2014-07-01 LAB — URINALYSIS W MICROSCOPIC + REFLEX CULTURE
Bilirubin Urine: NEGATIVE
Casts: NONE SEEN
Crystals: NONE SEEN
Glucose, UA: NEGATIVE mg/dL
Ketones, ur: NEGATIVE mg/dL
LEUKOCYTES UA: NEGATIVE
NITRITE: NEGATIVE
PROTEIN: NEGATIVE mg/dL
Specific Gravity, Urine: <1.005 — ABNORMAL LOW (ref 1.005–1.030)
Urobilinogen, UA: 0.2 mg/dL (ref 0.0–1.0)
WBC UA: NONE SEEN WBC/hpf (ref <3)
pH: 5 (ref 5.0–8.0)

## 2014-07-01 MED ORDER — METRONIDAZOLE 500 MG PO TABS: 500.0000 mg | ORAL_TABLET | Freq: Two times a day (BID) | ORAL | Status: DC

## 2014-07-01 MED ORDER — FLUCONAZOLE 150 MG PO TABS: 150.0000 mg | ORAL_TABLET | Freq: Once | ORAL | Status: DC

## 2014-07-01 NOTE — Addendum Note (Signed)
Addended by: Dayna BarkerGARDNER, Marguerette Sheller K on: 07/01/2014 12:47 PM   Modules accepted: Orders

## 2014-07-01 NOTE — Patient Instructions (Signed)
Take the Diflucan pill once for the yeast infection. Take the Flagyl (metronidazole) medication twice daily for 7 days for the bacterial infection. Avoid alcohol while taking. Put warm soaks on the boil and that should help it come to ahead and drain. Follow up if this gets worse or persists.  Follow up for your annual exam as scheduled.

## 2014-07-01 NOTE — Progress Notes (Addendum)
Mikayla LothWanda G Mason [birth year] [id number]        63 y.o.  Patient has not been in the office for a number of years presents complaining of vaginal discharge and irritation of the last several weeks. Also a boil on her right thigh that has been present for a week and seems to be getting better. It has not drained. No urinary symptoms such as frequency dysuria or urgency. Status post hysterectomy in the past.  Past medical history,surgical history, problem list, medications, allergies, family history and social history were all reviewed and documented in the EPIC chart.  Directed ROS with pertinent positives and negatives documented in the history of present illness/assessment and plan.  Exam: [name] General appearance:  Normal Upper inner right thigh with classic small 2 cm boil pointing but not draining. No surrounding cellulitis. Abdomen soft nontender without masses guarding rebound organomegaly Pelvic external BUS vagina with frothy white discharge. Vaginal mucosa erythematous. Bimanual without masses or tenderness rectal exam is normal  Assessment/Plan:  63 y.o. with:  1. Vaginal discharge and irritation. Wet prep shows some yeast. Clinically it appears bacterial. Will cover both with Diflucan 150 mg x1 dose and Flagyl 500 mg twice a day x7 days, alcohol avoidance reviewed. Follow up if symptoms persist, worsen or recur. 2. Right upper inner thigh boil. Historically seems to be getting better. It is fluctuant but not draining. Recommended warm soaks to the area. Follow up if persists or worsens. Patient is way overdue for annual exam and she knows it and has it scheduled in November. She is in the process of arranging for her mammography now.     Dara LordsFONTAINE,Katlynne Mckercher P MD, 12:37 PM 07/01/2014

## 2014-07-02 LAB — URINE CULTURE
COLONY COUNT: NO GROWTH
ORGANISM ID, BACTERIA: NO GROWTH

## 2014-08-19 ENCOUNTER — Ambulatory Visit: Payer: Self-pay | Admitting: Gynecology

## 2014-10-08 ENCOUNTER — Other Ambulatory Visit: Payer: Self-pay | Admitting: Cardiology

## 2014-10-21 ENCOUNTER — Other Ambulatory Visit: Payer: Self-pay

## 2014-10-21 MED ORDER — METOPROLOL SUCCINATE ER 100 MG PO TB24: ORAL_TABLET | ORAL | Status: DC

## 2014-10-31 ENCOUNTER — Telehealth: Payer: Self-pay | Admitting: *Deleted

## 2014-10-31 NOTE — Telephone Encounter (Signed)
Pt called to follow up from OV on 07/01/14 the boil has reappeared, pt though the Rx for Diflucan 150 mg x1 dose and Flagyl 500 mg twice a day x7 days was to treat the boil. I explained to pt that per TF note she was to follow up if this should happen. Pt will do warm soaks to area as directed on OV note. Pt transferred to front desk to schedule next week.

## 2014-11-05 ENCOUNTER — Encounter: Payer: Self-pay | Admitting: Gynecology

## 2014-11-05 ENCOUNTER — Ambulatory Visit (INDEPENDENT_AMBULATORY_CARE_PROVIDER_SITE_OTHER): Payer: BC Managed Care – PPO | Admitting: Gynecology

## 2014-11-05 DIAGNOSIS — M545 Low back pain: Secondary | ICD-10-CM

## 2014-11-05 DIAGNOSIS — N764 Abscess of vulva: Secondary | ICD-10-CM

## 2014-11-05 LAB — URINALYSIS W MICROSCOPIC + REFLEX CULTURE
Bilirubin Urine: NEGATIVE
CASTS: NONE SEEN
Crystals: NONE SEEN
Glucose, UA: NEGATIVE mg/dL
KETONES UR: NEGATIVE mg/dL
NITRITE: NEGATIVE
PH: 5 (ref 5.0–8.0)
Protein, ur: NEGATIVE mg/dL
Specific Gravity, Urine: 1.01 (ref 1.005–1.030)
Urobilinogen, UA: 0.2 mg/dL (ref 0.0–1.0)

## 2014-11-05 NOTE — Progress Notes (Signed)
Mikayla LothWanda G Mason 06/16/1951 [id number]        64 y.o.  Presents with several issues. The first is several days ago she developed a boil in her right upper labia majora/lower mons region. She applied heat and it ultimately drained. She's had a previous episode of this. Also notes some low back pain and wonders whether she is getting a urinary tract infection. She's not having any frequency dysuria urgency or lower abdominal discomfort. Does have a history of low back pain secondary to arthritis.no fever chills nausea vomiting diarrhea constipation.  Lastly patient is complaining of vaginal dryness is a chronic issue. Is not sexually active and is not having an issue with dyspareunia.  Past medical history,surgical history, problem list, medications, allergies, family history and social history were all reviewed and documented in the EPIC chart.  Directed ROS with pertinent positives and negatives documented in the history of present illness/assessment and plan.  Exam: Kim assistant General appearance:  Normal Spine straight with point tenderness left paraspinal region. No muscle spasm or palpable abnormalities. This is see region where she notes chronic pain. Abdomen soft nontender without masses guarding rebound organomegaly. Pelvic external BUS vagina with generalized atrophic changes. Healing small boil right upper labia majora/lower mons. No evidence of cellulitis or regional adenopathy. Bimanual exam without masses or tenderness  Assessment/Plan:  64 y.o. with low back pain which I think is orthopedic.  Urinalysis does show rare bacteria and will check culture to be sure but I recommended she follow up with her orthopedic surgeon. Small healing right labial boil. Recommend observation. If recurrent apply heat early to see if it does not help drain. If it persists then follow up for evaluation.  Lastly we discussed her vaginal dryness. Options to include OTC vaginal moisturizers reviewed. Vaginal  estrogen supplementation also discussed the patient's very leery of this and would prefer to try the OTC products. She'll follow up if this continues to be a problem.     Dara LordsFONTAINE,Snigdha Howser P MD, 10:10 AM 11/05/2014

## 2014-11-05 NOTE — Patient Instructions (Signed)
Office will call you if the urine culture grows out any bacteria.  Try an over-the-counter vaginal moisturizer such as Replens to help with the vaginal dryness.

## 2014-11-05 NOTE — Addendum Note (Signed)
Addended by: Dayna BarkerGARDNER, Audyn Dimercurio K on: 11/05/2014 12:22 PM   Modules accepted: Orders

## 2014-11-07 LAB — URINE CULTURE
Colony Count: NO GROWTH
Organism ID, Bacteria: NO GROWTH

## 2014-11-27 ENCOUNTER — Encounter: Payer: Self-pay | Admitting: Cardiology

## 2014-11-27 ENCOUNTER — Ambulatory Visit (INDEPENDENT_AMBULATORY_CARE_PROVIDER_SITE_OTHER): Payer: BC Managed Care – PPO | Admitting: Cardiology

## 2014-11-27 VITALS — BP 130/76 | HR 77 | Ht 67.5 in | Wt 239.6 lb

## 2014-11-27 DIAGNOSIS — I493 Ventricular premature depolarization: Secondary | ICD-10-CM

## 2014-11-27 DIAGNOSIS — E7800 Pure hypercholesterolemia, unspecified: Secondary | ICD-10-CM

## 2014-11-27 DIAGNOSIS — I119 Hypertensive heart disease without heart failure: Secondary | ICD-10-CM

## 2014-11-27 LAB — HEPATIC FUNCTION PANEL
ALT: 25 U/L (ref 0–35)
AST: 18 U/L (ref 0–37)
Albumin: 4.1 g/dL (ref 3.5–5.2)
Alkaline Phosphatase: 65 U/L (ref 39–117)
BILIRUBIN DIRECT: 0.1 mg/dL (ref 0.0–0.3)
BILIRUBIN TOTAL: 0.7 mg/dL (ref 0.2–1.2)
Total Protein: 7.1 g/dL (ref 6.0–8.3)

## 2014-11-27 LAB — BASIC METABOLIC PANEL
BUN: 15 mg/dL (ref 6–23)
CHLORIDE: 105 meq/L (ref 96–112)
CO2: 31 mEq/L (ref 19–32)
Calcium: 9.3 mg/dL (ref 8.4–10.5)
Creatinine, Ser: 0.62 mg/dL (ref 0.40–1.20)
GFR: 103.01 mL/min (ref >60.00)
Glucose, Bld: 98 mg/dL (ref 70–99)
POTASSIUM: 4 meq/L (ref 3.5–5.1)
Sodium: 139 mEq/L (ref 135–145)

## 2014-11-27 LAB — LIPID PANEL
CHOL/HDL RATIO: 4
Cholesterol: 164 mg/dL (ref 0–200)
HDL: 38.1 mg/dL — ABNORMAL LOW (ref >39.00)
LDL Cholesterol: 91 mg/dL (ref 0–99)
NonHDL: 125.9
TRIGLYCERIDES: 173 mg/dL — AB (ref 0.0–149.0)
VLDL: 34.6 mg/dL (ref 0.0–40.0)

## 2014-11-27 NOTE — Patient Instructions (Addendum)
Will obtain labs today and call you with the results (lp/bmet/hfp)I  ncrease your walking and work harder on weight loss  Your physician wants you to follow-up in: 1 year ov/ekg  You will receive a reminder letter in the mail two months in advance. If you don't receive a letter, please call our office to schedule the follow-up appointment.

## 2014-11-27 NOTE — Progress Notes (Signed)
Cardiology Office Note   Date:  11/27/2014   ID:  [name], DOB 07/21/1951, MRN [id number]  PCP:  Pamelia HoitWILSON,FRED HENRY, MD  Cardiologist:   Cassell Clementhomas Oceanna Arruda, MD   No chief complaint on file.     History of Present Illness: Mikayla LothWanda G Fifer is a 64 y.o. female who presents for a one-year follow-up office visit.  This pleasant 64 year old woman is seen after a one-year absence. She has a past history of essential hypertension and exogenous obesity.  She has a history of benign PVCs.  We last saw her in 2014.. In October 2012 she developed severe epigastric pain and was hospitalized emergently and underwent emergency surgery by Dr. Violeta GelinasBurke Thompson for a Meckel's diverticulum. She was hospitalized for 5 days and she was out of work for 2 and half months. 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 The patient has not been expressing any chest pain.  She notes occasional palpitations.  She's had no dizziness or syncope.  She has not been getting much regular exercise.  She has not been as careful with her diet.  Her weight is up 13 pounds since last visit. Her family history reveals that her mother is alive at age 64 has atrial fibrillation.  Her father died of acute alcohol poisoning at age 64.  Past Medical History  Diagnosis Date  . PVC's (premature ventricular contractions)   . HTN (hypertension)   . Exogenous obesity   . Hypercholesteremia     Past Surgical History  Procedure Laterality Date  . Hysterectomy - unknown type    . Lap resection of perforated diverticulum  07/08/2011    drainage of intraabdominal abscess  . Colon surgery  07/08/11    emergency colon surgery      Current Outpatient Prescriptions  Medication Sig Dispense Refill  . Acetaminophen (TYLENOL PO) Take 100 mg by mouth. Taking 2 daily     . amLODipine (NORVASC) 5 MG tablet Take 5 mg by mouth as directed. 1/2 tablet daily    . doxycycline (VIBRAMYCIN) 100 MG capsule Take 100 mg by mouth daily.     .  fluticasone (FLONASE) 50 MCG/ACT nasal spray Place 1 spray into both nostrils daily.     . metoprolol succinate (TOPROL-XL) 100 MG 24 hr tablet TAKE (1/2) TABLET TWICE DAILY. 30 tablet 3  . Omeprazole (PRILOSEC PO) Take 1 capsule by mouth.      No current facility-administered medications for this visit.    Allergies:   Diovan; Amoxicillin; Augmentin; Hctz; Zithromax; Penicillins; and Sulfa antibiotics    Social History:  The patient  reports that she has never smoked. She has never used smokeless tobacco. She reports that she does not drink alcohol or use illicit drugs.   Family History:  The patient's family history includes Coronary artery disease in an other family member; Diabetes in an other family member; Hypertension in an other family member.    ROS:  Please see the history of present illness.   Otherwise, review of systems are positive for none.   All other systems are reviewed and negative.    PHYSICAL EXAM: VS:  BP 130/76 mmHg  Pulse 77  Ht 5' 7.5" (1.715 m)  Wt 239 lb 9.6 oz (108.682 kg)  BMI 36.95 kg/m2 , BMI Body mass index is 36.95 kg/(m^2). GEN: Well nourished, well developed, in no acute distress HEENT: normal Neck: no JVD, carotid bruits, or masses Cardiac: RRR; no murmurs, rubs, or gallops,no edema  Respiratory:  clear to auscultation bilaterally, normal work of breathing GI: soft, nontender, nondistended, + BS MS: no deformity or atrophy Skin: warm and dry, no rash Neuro:  Strength and sensation are intact Psych: euthymic mood, full affect   EKG:  EKG is ordered today. The ekg ordered today demonstrates normal sinus rhythm with occasional PVC.  Otherwise within normal limits.   Recent Labs: No results found for requested labs within last 365 days.    Lipid Panel    Component Value Date/Time   CHOL 187 02/01/2013 1456   TRIG 153.0* 02/01/2013 1456   HDL 35.70* 02/01/2013 1456   CHOLHDL 5 02/01/2013 1456   VLDL 30.6 02/01/2013 1456   LDLCALC 121*  02/01/2013 1456   LDLDIRECT 158.3 03/09/2011 0837      Wt Readings from Last 3 Encounters:  11/27/14 239 lb 9.6 oz (108.682 kg)  02/01/13 226 lb 12.8 oz (102.876 kg)  04/06/12 223 lb (101.152 kg)         ASSESSMENT AND PLAN:  1.  Essential hypertension without heart failure 2.  Exogenous obesity 3.  History of PVCs 4.  Hypercholesterolemia.  She has not had her cholesterol checked since we last saw her more than a year ago.  We will get fasting labs today.   Current medicines are reviewed at length with the patient today.  The patient does not have concerns regarding medicines.  The following changes have been made:  no change  Labs/ tests ordered today include: Lipid panel, hepatic function panel, and basal metabolic panel   Orders Placed This Encounter  Procedures  . Lipid panel  . Hepatic function panel  . Basic metabolic panel  . EKG 12-Lead   The patient will work harder on exercise and weight loss.  I suggested that she might want to join a gym or some type of scheduled exercise class.  Her husband who is with her today states that he needs to lose weight also  Disposition:   FU with Dr. Patty Sermons in 1 Year for office visit and EKG.  Await results of lab work drawn today   Signed, Cassell Clement, MD  11/27/2014 11:51 AM    [facility] [address] [place], [place], [place]  [address] Phone: [phone]; Fax: [phone]

## 2015-03-07 ENCOUNTER — Other Ambulatory Visit: Payer: Self-pay | Admitting: Cardiology

## 2015-06-08 ENCOUNTER — Telehealth: Payer: Self-pay | Admitting: Cardiology

## 2015-06-08 NOTE — Telephone Encounter (Signed)
Numbers given to patient as requested

## 2015-06-08 NOTE — Telephone Encounter (Signed)
New message     Patient calling for cholesterol # - HDL, LDL, Trig. Has to filled out form for [facility] BS.

## 2015-10-26 ENCOUNTER — Encounter: Payer: Self-pay | Admitting: Cardiology

## 2015-10-26 ENCOUNTER — Telehealth: Payer: Self-pay | Admitting: Cardiology

## 2015-10-26 NOTE — Telephone Encounter (Signed)
OK. Noted 

## 2015-10-26 NOTE — Telephone Encounter (Signed)
Will forward to  Dr. Brackbill for review 

## 2015-10-26 NOTE — Telephone Encounter (Signed)
New Message ° °This message is to inform you that we have made 3 consecutive attempts to contact the patient. We have also mailed a letter to the patient to inform them to call in and schedule. Although we were unsuccessful in these attempts we wanted you to be aware of our efforts. Will remove the patient from our recall list at this time  ° ° °Shanti °[facility] ° °

## 2015-11-12 ENCOUNTER — Other Ambulatory Visit: Payer: Self-pay

## 2015-11-12 DIAGNOSIS — Z1231 Encounter for screening mammogram for malignant neoplasm of breast: Secondary | ICD-10-CM

## 2015-11-19 ENCOUNTER — Encounter: Payer: Self-pay | Admitting: Cardiology

## 2015-11-19 ENCOUNTER — Ambulatory Visit (INDEPENDENT_AMBULATORY_CARE_PROVIDER_SITE_OTHER): Payer: BC Managed Care – PPO | Admitting: Cardiology

## 2015-11-19 VITALS — BP 140/80 | HR 72 | Ht 67.5 in | Wt 240.2 lb

## 2015-11-19 DIAGNOSIS — I493 Ventricular premature depolarization: Secondary | ICD-10-CM

## 2015-11-19 DIAGNOSIS — I119 Hypertensive heart disease without heart failure: Secondary | ICD-10-CM

## 2015-11-19 NOTE — Progress Notes (Signed)
Cardiology Office Note   Date:  11/19/2015   ID:  [name], DOB Jan 17, 1951, MRN [id number]  PCP:  Pamelia Hoit, MD  Cardiologist: Cassell Clement MD  Chief Complaint  Patient presents with  . scheduled follow up      History of Present Illness: [name] is a 65 y.o. female who presents for a one-year follow-up visit  She has a past history of essential hypertension and exogenous obesity. She has a history of benign PVCs. We last saw her in March 2016. In October 2012 she developed severe epigastric pain and was hospitalized emergently and underwent emergency surgery by Dr. Violeta Gelinas for a Meckel's diverticulum. She was hospitalized for 5 days and she was out of work for 2 and half months. 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 The patient has not been expressing any chest pain. She notes occasional palpitations. She's had no dizziness or syncope. She has not been getting much regular exercise. She has not been as careful with her diet. Her weight is up 1 pound since last visit. Her family history is positive for atrial fibrillation and her mother. Since last visit the patient has been having problems with fatigue.  She attributes this to inactivity over the winter.  She is looking forward to getting out and working in the yard and garden this spring. She has had problems with rosacea and with chronic blepharitis.  Her ophthalmologist has her on doxycycline daily.  The patient also has cataracts. The patient has been having low back pain and has been told that she has several deteriorating disks as well as bone spurs. She has not been aware of any PVCs recently.  Past Medical History  Diagnosis Date  . PVC's (premature ventricular contractions)   . HTN (hypertension)   . Exogenous obesity   . Hypercholesteremia     Past Surgical History  Procedure Laterality Date  . Hysterectomy - unknown type    . Lap resection of perforated diverticulum   07/08/2011    drainage of intraabdominal abscess  . Colon surgery  07/08/11    emergency colon surgery      Current Outpatient Prescriptions  Medication Sig Dispense Refill  . Acetaminophen (TYLENOL PO) Take 250 mg by mouth 2 (two) times daily as needed (pain).     Marland Kitchen amLODipine (NORVASC) 5 MG tablet Take 0.5 tablets (2.5 mg total) by mouth daily. 45 tablet 3  . doxycycline (VIBRAMYCIN) 50 MG capsule Take 50 mg by mouth 2 (two) times daily.    . fluticasone (FLONASE) 50 MCG/ACT nasal spray Place 1 spray into both nostrils daily.     . metoprolol succinate (TOPROL-XL) 100 MG 24 hr tablet Take 50 mg by mouth 2 (two) times daily. Take with or immediately following a meal.     No current facility-administered medications for this visit.    Allergies:   Diovan; Amoxicillin; Augmentin; Hctz; Zithromax; Penicillins; and Sulfa antibiotics    Social History:  The patient  reports that she has never smoked. She has never used smokeless tobacco. She reports that she does not drink alcohol or use illicit drugs.   Family History:  The patient's family history is not on file.    ROS:  Please see the history of present illness.   Otherwise, review of systems are positive for none.   All other systems are reviewed and negative.    PHYSICAL EXAM: VS:  BP 140/80 mmHg  Pulse 72  Ht 5' 7.5" (  1.715 m)  Wt 240 lb 3.2 oz (108.954 kg)  BMI 37.04 kg/m2 , BMI Body mass index is 37.04 kg/(m^2). GEN: Well nourished, well developed, overweight woman in no acute distress HEENT: normal Neck: no JVD, carotid bruits, or masses Cardiac: RRR; no murmurs, rubs, or gallops,no edema  Respiratory:  clear to auscultation bilaterally, normal work of breathing GI: soft, nontender, nondistended, + BS MS: no deformity or atrophy Skin: warm and dry, no rash Neuro:  Strength and sensation are intact Psych: euthymic mood, full affect   EKG:  EKG is ordered today. The ekg ordered today demonstrates normal sinus  rhythm.  Within normal limits.   Recent Labs: 11/27/2014: ALT 25; BUN 15; Creatinine, Ser 0.62; Potassium 4.0; Sodium 139    Lipid Panel    Component Value Date/Time   CHOL 164 11/27/2014 1204   TRIG 173.0* 11/27/2014 1204   HDL 38.10* 11/27/2014 1204   CHOLHDL 4 11/27/2014 1204   VLDL 34.6 11/27/2014 1204   LDLCALC 91 11/27/2014 1204   LDLDIRECT 158.3 03/09/2011 0837      Wt Readings from Last 3 Encounters:  11/19/15 240 lb 3.2 oz (108.954 kg)  11/27/14 239 lb 9.6 oz (108.682 kg)  02/01/13 226 lb 12.8 oz (102.876 kg)         ASSESSMENT AND PLAN:  1. Essential hypertension without heart failure 2. Exogenous obesity 3. History of PVCs, no recent symptoms 4. Hypercholesterolemia. She is on no statin therapy.  Last cholesterol was 164 total with LDL of 91, one year ago.   Current medicines are reviewed at length with the patient today.  The patient does not have concerns regarding medicines.  The following changes have been made:  no change  Labs/ tests ordered today include:   Orders Placed This Encounter  Procedures  . EKG 12-Lead     Disposition:   Continue current medication.  Needs to work harder on weight loss.  Avoid carbohydrates] in particular.  Following my retirement she will follow-up for cardiology with Dr. Antoine Poche in [place] in one year.  Karie Schwalbe MD 11/19/2015 11:56 AM    [facility] [address] [place], [place], [place]  [address] Phone: [phone]; Fax: [phone]

## 2015-11-19 NOTE — Patient Instructions (Addendum)
Medication Instructions:  Your physician recommends that you continue on your current medications as directed. Please refer to the Current Medication list given to you today.  Labwork: none  Testing/Procedures: none  Follow-Up: Your physician wants you to follow-up in: 1 year Dr Antoine Poche at the [facility] will receive a reminder letter in the mail two months in advance. If you don't receive a letter, please call our office to schedule the follow-up appointment.  If you need a refill on your cardiac medications before your next appointment, please call your pharmacy.

## 2015-11-24 DIAGNOSIS — I3489 Other nonrheumatic mitral valve disorders: Secondary | ICD-10-CM | POA: Insufficient documentation

## 2015-11-24 DIAGNOSIS — K219 Gastro-esophageal reflux disease without esophagitis: Secondary | ICD-10-CM

## 2015-11-24 DIAGNOSIS — H9190 Unspecified hearing loss, unspecified ear: Secondary | ICD-10-CM

## 2015-11-24 HISTORY — DX: Unspecified hearing loss, unspecified ear: H91.90

## 2015-11-24 HISTORY — DX: Gastro-esophageal reflux disease without esophagitis: K21.9

## 2015-11-27 ENCOUNTER — Ambulatory Visit: Payer: BC Managed Care – PPO

## 2015-12-18 ENCOUNTER — Ambulatory Visit: Payer: BC Managed Care – PPO

## 2015-12-29 ENCOUNTER — Ambulatory Visit: Payer: BC Managed Care – PPO

## 2016-01-22 ENCOUNTER — Ambulatory Visit
Admission: RE | Admit: 2016-01-22 | Discharge: 2016-01-22 | Disposition: A | Discharge status: Home / Self Care | Payer: Medicare Other | Source: Ambulatory Visit

## 2016-01-22 DIAGNOSIS — Z1231 Encounter for screening mammogram for malignant neoplasm of breast: Secondary | ICD-10-CM

## 2016-03-15 ENCOUNTER — Other Ambulatory Visit: Payer: Self-pay | Admitting: *Deleted

## 2016-03-15 MED ORDER — METOPROLOL SUCCINATE ER 100 MG PO TB24: ORAL_TABLET | ORAL | Status: DC

## 2016-03-15 MED ORDER — AMLODIPINE BESYLATE 5 MG PO TABS: 2.5000 mg | ORAL_TABLET | Freq: Every day | ORAL | Status: DC

## 2016-05-17 ENCOUNTER — Telehealth: Payer: Self-pay | Admitting: Cardiovascular Disease

## 2016-05-17 MED ORDER — METOPROLOL TARTRATE 50 MG PO TABS: 50.0000 mg | ORAL_TABLET | Freq: Two times a day (BID) | ORAL | 3 refills | Status: DC

## 2016-05-17 NOTE — Telephone Encounter (Signed)
Calling stating since she had her Toprol XL refilled in June she has been having itching and stinging all over her body.  [facility] and they have given her 7 pills from different manufacturers and the results have been the same.  She has done this 3 different times.  Pharmacist gave her a different manufacture yesterday and she took the 50 mg last PM and the itching and stinging was even worse. She has tried to take Benadryl 1/4 dose and caused her tongue to swell. She has been on Toprol XL 100 mg 1/2 tablet twice a day since before 2013 for her PVC's and has not had any problem until the refill in June.  Spoke w/ Rogers SeedsSally Earl,pharmacist who suggest that she can try taking the Toprol XL 50 mg once a day; Toprol tartrate 50 mg BID; or Carvedilol 6.27 mg BID.  She states she has been taking the Toprol XL 50 mg once a day for past 2 days. She would like to try the short acting Toprol Tartrate 50 mg Bid for 7 days until seen by Carlean JewsKatie Thompson, PA on Mon 8/28.  Will [facility] Rx [facility].  She will call back if this doesn't seem to be helping or she continues to have the itching.  Then will try and change her to Carvedilol. She is requesting to be followed by Dr. Elease HashimotoNahser.

## 2016-05-17 NOTE — Telephone Encounter (Signed)
New Message  Pt is former [place] pt and request to be NEW Nahser pt. Pt has an appt set with K. Thompson on 8/10  Pt c/o medication issue:  1. Name of Medication: pt states it is the generic for Toprol   2. How are you currently taking this medication (dosage and times per day)? Pt does not have bottle in front of her   3. Are you having a reaction (difficulty breathing--STAT)? Pt states it is causing her to itch  4. What is your medication issue? Pt states she is having a reaction to the med and would like further instructions. Please call back to discuss

## 2016-05-17 NOTE — Telephone Encounter (Signed)
Follow up    Pt verbalized that she is returning rn call

## 2016-05-17 NOTE — Telephone Encounter (Signed)
Attempted to call pt. Phone ringed several times no answer.

## 2016-05-20 ENCOUNTER — Encounter: Payer: Self-pay | Admitting: Physician Assistant

## 2016-05-22 NOTE — Progress Notes (Signed)
Cardiology Office Note    Date:  05/23/2016   ID:  [name], DOB [birth year], MRN [id number]  PCP:  Pamelia Hoit, MD  Cardiologist:  Dr. Patty Sermons --> Dr. Melburn Popper   CC: cardiology follow up.   History of Present Illness:  [name] is a 65 y.o. female with a history of HTN, obesity, PVCs and back pain who presents to clinic for cardiology follow up.   She recently felt like she had stinging all over her body which she felt was from the Toprol XL. She has now been on the Lopressor 25mg  BID which she has been tolerating well. She has had some numbness on her left arm which is upsetting to her. She has had no chest pain or SOB. She doesn't tolerate the heat but loves to be outside. No LE edema, orthopnea or PND. No dizziness or syncope. NO pain in legs with walking. She sometimes has a few skips in her heart that has been going on forever. No complaints at all except for having the itching burning on Toprol that has resolved on Lopressor 50mg  BID. 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 She does not formally exercise but does a lot of yard work and walks around her [place] acres. No exertional chest pain or SOB. SHe does sometimes get fatigued but this is not new. She shows be pictures of her garden which she is passionate about. She also has three grand daughters she helps take care of.     Past Medical History:  Diagnosis Date  . Exogenous obesity   . HTN (hypertension)   . Hypercholesteremia   . PVC's (premature ventricular contractions)     Past Surgical History:  Procedure Laterality Date  . COLON SURGERY  07/08/11   emergency colon surgery   . hysterectomy - unknown type    . lap resection of perforated diverticulum  07/08/2011   drainage of intraabdominal abscess    Current Medications: Outpatient Medications Prior to Visit  Medication Sig Dispense Refill  . Acetaminophen (TYLENOL PO) Take 250 mg by mouth 2 (two) times daily as needed (pain).     Marland Kitchen  amLODipine (NORVASC) 5 MG tablet Take 0.5 tablets (2.5 mg total) by mouth daily. 45 tablet 3  . doxycycline (VIBRAMYCIN) 50 MG capsule Take 50 mg by mouth 2 (two) times daily.    . fluticasone (FLONASE) 50 MCG/ACT nasal spray Place 1 spray into both nostrils daily.     . metoprolol (LOPRESSOR) 50 MG tablet Take 1 tablet (50 mg total) by mouth 2 (two) times daily. Take one tablet by mouth twice a day 14 tablet 3   No facility-administered medications prior to visit.      Allergies:   Diovan [valsartan]; Amoxicillin; Augmentin [amoxicillin-pot clavulanate]; Hctz [hydrochlorothiazide]; Zithromax [azithromycin]; Fexofenadine; Penicillins; Toprol xl [metoprolol succinate er]; Sulfa antibiotics; and Sulfacetamide sodium   Social History   Social History  . Marital status: Married    Spouse name: N/A  . Number of children: N/A  . Years of education: N/A   Social History Main Topics  . Smoking status: Never Smoker  . Smokeless tobacco: Never Used  . Alcohol use No  . Drug use: No  . Sexual activity: Not on file   Other Topics Concern  . Not on file   Social History Narrative  . No narrative on file     Family History:  The patient's family history is positive for atrial fibrillation and her mother.  ROS:   Please see the history of present illness.    ROS All other systems reviewed and are negative.   PHYSICAL EXAM:   VS:  BP 140/86   Pulse 72   Ht 5' 7.5" (1.715 m)   Wt 233 lb 12.8 oz (106.1 kg)   BMI 36.08 kg/m    GEN: Well nourished, well developed, in no acute distress  obese HEENT: normal  Neck: no JVD, carotid bruits, or masses Cardiac: RRR; no murmurs, rubs, or gallops no edema Respiratory:  clear to auscultation bilaterally, normal work of breathing GI: soft, nontender, nondistended, + BS MS: no deformity or atrophy  Skin: warm and dry, no rash Neuro:  Alert and Oriented x 3, Strength and sensation are intact Psych: euthymic mood, Mason affect  Wt Readings  from Last 3 Encounters:  05/23/16 233 lb 12.8 oz (106.1 kg)  11/19/15 240 lb 3.2 oz (109 kg)  11/27/14 239 lb 9.6 oz (108.7 kg)      Studies/Labs Reviewed:   EKG:  EKG is ordered today.  The ekg ordered today demonstrates HR 72 NSR with peaked TW similar to previous.   Recent Labs: No results found for requested labs within last 8760 hours.   Lipid Panel    Component Value Date/Time   CHOL 164 11/27/2014 1204   TRIG 173.0 (H) 11/27/2014 1204   HDL 38.10 (L) 11/27/2014 1204   CHOLHDL 4 11/27/2014 1204   VLDL 34.6 11/27/2014 1204   LDLCALC 91 11/27/2014 1204   LDLDIRECT 158.3 03/09/2011 0837    Additional studies/ records that were reviewed today include:  2D ECHO 2007 with normal LV function and no RWMAs   ASSESSMENT & PLAN:   Essential hypertension without heart failure: BP well controlled on current regimen   Exogenous obesity: encouraged weight loss and exercise. Weight is down 7 lbs down from 10/2015. She has been weight watchers.   History of PVCs: continue BB. No recent symptoms  HLD: She is on no statin therapy.  LDL 91 in 11/2014. Will get repeat lipids and LFTs at her earliest convenience   Medication Adjustments/Labs and Tests Ordered: Current medicines are reviewed at length with the patient today.  Concerns regarding medicines are outlined above.  Medication changes, Labs and Tests ordered today are listed in the Patient Instructions below. Patient Instructions  Medication Instructions: None ordered  Labwork: None ordered  Testing/Procedures: None ordered  Follow-Up: Your physician recommends that you schedule a follow-up appointment in: SEE DR. HOCHREIN AS PLANNED   Any Other Special Instructions Will Be Listed Below (If Applicable).     If you need a refill on your cardiac medications before your next appointment, please call your pharmacy.      Signed, Cline CrockKathryn Brenda Samano, PA-C  05/23/2016 10:36 AM    [facility] [address], [place], [place]  [address] Phone: [phone](336) [phone]; Fax: [phone]

## 2016-05-23 ENCOUNTER — Ambulatory Visit (INDEPENDENT_AMBULATORY_CARE_PROVIDER_SITE_OTHER): Payer: Medicare Other | Admitting: Physician Assistant

## 2016-05-23 ENCOUNTER — Encounter: Payer: Self-pay | Admitting: Physician Assistant

## 2016-05-23 VITALS — BP 140/86 | HR 72 | Ht 67.5 in | Wt 233.8 lb

## 2016-05-23 DIAGNOSIS — I493 Ventricular premature depolarization: Secondary | ICD-10-CM | POA: Diagnosis not present

## 2016-05-23 DIAGNOSIS — I119 Hypertensive heart disease without heart failure: Secondary | ICD-10-CM

## 2016-05-23 DIAGNOSIS — E7800 Pure hypercholesterolemia, unspecified: Secondary | ICD-10-CM | POA: Diagnosis not present

## 2016-05-23 NOTE — Patient Instructions (Addendum)
Medication Instructions: None ordered  Labwork: None ordered  Testing/Procedures: None ordered  Follow-Up: Your physician wants you to follow-up in: 1 YEAR WITH DR. Elease HashimotoNAHSER   You will receive a reminder letter in the mail two months in advance. If you don't receive a letter, please call our office to schedule the follow-up appointment.    Any Other Special Instructions Will Be Listed Below (If Applicable).     If you need a refill on your cardiac medications before your next appointment, please call your pharmacy.

## 2016-05-26 ENCOUNTER — Other Ambulatory Visit: Payer: Self-pay

## 2016-05-26 MED ORDER — METOPROLOL TARTRATE 50 MG PO TABS: 50.0000 mg | ORAL_TABLET | Freq: Two times a day (BID) | ORAL | 6 refills | Status: DC

## 2016-10-31 DIAGNOSIS — B9789 Other viral agents as the cause of diseases classified elsewhere: Secondary | ICD-10-CM | POA: Insufficient documentation

## 2016-12-17 ENCOUNTER — Other Ambulatory Visit: Payer: Self-pay | Admitting: Cardiovascular Disease

## 2017-04-12 ENCOUNTER — Other Ambulatory Visit: Payer: Self-pay | Admitting: Cardiology

## 2017-06-03 ENCOUNTER — Other Ambulatory Visit: Payer: Self-pay | Admitting: Cardiovascular Disease

## 2017-06-06 ENCOUNTER — Other Ambulatory Visit: Payer: Self-pay

## 2017-06-06 MED ORDER — AMLODIPINE BESYLATE 5 MG PO TABS: ORAL_TABLET | ORAL | 0 refills | Status: DC

## 2017-06-06 MED ORDER — METOPROLOL TARTRATE 50 MG PO TABS: ORAL_TABLET | ORAL | 0 refills | Status: DC

## 2017-06-21 ENCOUNTER — Encounter: Payer: Self-pay | Admitting: Cardiovascular Disease

## 2017-06-29 ENCOUNTER — Encounter: Payer: Self-pay | Admitting: Cardiovascular Disease

## 2017-06-29 ENCOUNTER — Encounter (INDEPENDENT_AMBULATORY_CARE_PROVIDER_SITE_OTHER): Payer: Self-pay

## 2017-06-29 ENCOUNTER — Ambulatory Visit (INDEPENDENT_AMBULATORY_CARE_PROVIDER_SITE_OTHER): Payer: Medicare Other | Admitting: Cardiovascular Disease

## 2017-06-29 VITALS — BP 142/85 | HR 74 | Ht 67.5 in | Wt 231.8 lb

## 2017-06-29 DIAGNOSIS — I10 Essential (primary) hypertension: Secondary | ICD-10-CM

## 2017-06-29 NOTE — Progress Notes (Signed)
Cardiology Office Note:    Date:  06/29/2017   ID:  [name], DOB 02/16/1951, MRN [id number]  PCP:  Barbie Banner, MD  Cardiologist:  Kristeen Miss, MD  , previous [name] patient   Referring MD: Barbie Banner, MD   Problem list 1. Essential hypertension 2. Premature ventricular contractions   Chief Complaint  Patient presents with  . Follow-up    HTN, PVCs.       History of Present Illness:    [name] is a 66 y.o. female with a hx of Hypertension, obesity, premature ventricular contractions. She was PVCs seen by Dr. Patty Mason. Thedore Mins her today for the first time.   She is seen with her husband , [name] who is also a patient of mine.  BP is typically normal at home.   Is always elevated at the office. .  Hates to drive to [place] from [place].  She is having to care for her mother .      No CP or dyspnea.  Developed an allergy to toprol XL.   Switched to Metoprolol tartrate which seems to work better Getting some exercise,  Knows that she needs to lose weight .   Has occasional palpitations.  When under stress.   Past Medical History:  Diagnosis Date  . Exogenous obesity   . HTN (hypertension)   . Hypercholesteremia   . PVC's (premature ventricular contractions)     Past Surgical History:  Procedure Laterality Date  . COLON SURGERY  07/08/11   emergency colon surgery   . hysterectomy - unknown type    . lap resection of perforated diverticulum  07/08/2011   drainage of intraabdominal abscess    Current Medications: Current Meds  Medication Sig  . Acetaminophen (TYLENOL PO) Take 250 mg by mouth 2 (two) times daily as needed (pain).   Marland Kitchen amLODipine (NORVASC) 5 MG tablet Take 2.5 mg by mouth daily.   Marland Kitchen doxycycline (VIBRAMYCIN) 50 MG capsule Take 50 mg by mouth 2 (two) times daily.  . metoprolol tartrate (LOPRESSOR) 50 MG tablet Take 50 mg by mouth 2 (two) times daily.      Allergies:   Diovan [valsartan]; Amoxicillin; Augmentin  [amoxicillin-pot clavulanate]; Hctz [hydrochlorothiazide]; Zithromax [azithromycin]; Diphenhydramine hcl; Fexofenadine; Penicillins; Toprol xl [metoprolol tartrate]; Sulfa antibiotics; and Sulfacetamide sodium   Social History   Social History  . Marital status: Married    Spouse name: N/A  . Number of children: N/A  . Years of education: N/A   Social History Main Topics  . Smoking status: Never Smoker  . Smokeless tobacco: Never Used  . Alcohol use No  . Drug use: No  . Sexual activity: Not Asked   Other Topics Concern  . None   Social History Narrative  . None     Family History: The patient's family history includes Breast cancer in her mother; Coronary artery disease in her father; Heart disease in her father; Hypertension in her father. ROS:   Please see the history of present illness.     All other systems reviewed and are negative.  EKGs/Labs/Other Studies Reviewed:    The following studies were reviewed today:   EKG:  EKG is  ordered today.   Oct. 4, 2018:   NSR at 74.  Normal ECG   Recent Labs: No results found for requested labs within last 8760 hours.  Recent Lipid Panel    Component Value Date/Time   CHOL 164 11/27/2014 1204   TRIG 173.0 (H)  11/27/2014 1204   HDL 38.10 (L) 11/27/2014 1204   CHOLHDL 4 11/27/2014 1204   VLDL 34.6 11/27/2014 1204   LDLCALC 91 11/27/2014 1204   LDLDIRECT 158.3 03/09/2011 0837    Physical Exam:    VS:  BP (!) 150/84   Pulse 74   Ht 5' 7.5" (1.715 m)   Wt 231 lb 12.8 oz (105.1 kg)   BMI 35.77 kg/m     Wt Readings from Last 3 Encounters:  06/29/17 231 lb 12.8 oz (105.1 kg)  05/23/16 233 lb 12.8 oz (106.1 kg)  11/19/15 240 lb 3.2 oz (109 kg)     GEN:  Middle aged female, obese  HEENT: Normal NECK: No JVD; No carotid bruits LYMPHATICS: No lymphadenopathy CARDIAC:RR, no murmurs, rubs, gallops RESPIRATORY:  Clear to auscultation without rales, wheezing or rhonchi  ABDOMEN: Soft, non-tender, non-distended, obese   MUSCULOSKELETAL:  No edema; No deformity  SKIN: Warm and dry NEUROLOGIC:  Alert and oriented x 3 PSYCHIATRIC:  Normal affect   ASSESSMENT:    No diagnosis found. PLAN:    In order of problems listed above:  1. HTN:   BP is better after a few minutes.  Needs to greatly improve her diet. Needs to exercise , watch her diet, and lose weight. They eat out quite regularly and I'm sure that she's getting too much fat and salt in her diet. I've also advised her to start a regular exercise program.  Will see her in 1 year.   2. PVCs :  stable   Medication Adjustments/Labs and Tests Ordered: Current medicines are reviewed at length with the patient today.  Concerns regarding medicines are outlined above.  No orders of the defined types were placed in this encounter.  No orders of the defined types were placed in this encounter.   Signed, Kristeen Miss, MD  06/29/2017 10:46 AM    [facility]

## 2017-06-29 NOTE — Patient Instructions (Signed)
Medication Instructions:  Your physician recommends that you continue on your current medications as directed. Please refer to the Current Medication list given to you today.   Labwork: None Ordered   Testing/Procedures: None Ordered   Follow-Up: Your physician wants you to follow-up in: 1 year with Dr. Nahser.  You will receive a reminder letter in the mail two months in advance. If you don't receive a letter, please call our office to schedule the follow-up appointment.   If you need a refill on your cardiac medications before your next appointment, please call your pharmacy.   Thank you for choosing [facility]! Sharna Gabrys, RN [phone]    

## 2017-07-04 ENCOUNTER — Other Ambulatory Visit: Payer: Self-pay | Admitting: Cardiovascular Disease

## 2017-07-05 ENCOUNTER — Other Ambulatory Visit: Payer: Self-pay | Admitting: *Deleted

## 2018-05-24 ENCOUNTER — Other Ambulatory Visit: Payer: Self-pay | Admitting: Family Medicine

## 2018-05-24 DIAGNOSIS — N63 Unspecified lump in unspecified breast: Secondary | ICD-10-CM

## 2018-05-25 ENCOUNTER — Ambulatory Visit
Admission: RE | Admit: 2018-05-25 | Discharge: 2018-05-25 | Disposition: A | Discharge status: Home / Self Care | Payer: Medicare Other | Source: Ambulatory Visit | Attending: Family Medicine | Admitting: Family Medicine

## 2018-05-25 DIAGNOSIS — N63 Unspecified lump in unspecified breast: Secondary | ICD-10-CM

## 2018-06-26 ENCOUNTER — Other Ambulatory Visit: Payer: Self-pay | Admitting: Cardiovascular Disease

## 2018-07-02 ENCOUNTER — Other Ambulatory Visit: Payer: Self-pay | Admitting: Cardiovascular Disease

## 2018-07-13 DIAGNOSIS — J40 Bronchitis, not specified as acute or chronic: Secondary | ICD-10-CM

## 2018-07-13 HISTORY — DX: Bronchitis, not specified as acute or chronic: J40

## 2018-07-20 DIAGNOSIS — J22 Unspecified acute lower respiratory infection: Secondary | ICD-10-CM | POA: Insufficient documentation

## 2018-07-24 ENCOUNTER — Other Ambulatory Visit: Payer: Self-pay | Admitting: Cardiovascular Disease

## 2018-07-31 ENCOUNTER — Other Ambulatory Visit: Payer: Self-pay | Admitting: Cardiovascular Disease

## 2018-08-02 ENCOUNTER — Encounter (INDEPENDENT_AMBULATORY_CARE_PROVIDER_SITE_OTHER): Payer: Self-pay

## 2018-08-02 ENCOUNTER — Ambulatory Visit: Payer: Medicare Other | Admitting: Cardiology

## 2018-08-02 ENCOUNTER — Encounter: Payer: Self-pay | Admitting: Cardiology

## 2018-08-02 VITALS — BP 138/78 | HR 73 | Ht 67.5 in | Wt 232.0 lb

## 2018-08-02 DIAGNOSIS — I10 Essential (primary) hypertension: Secondary | ICD-10-CM | POA: Diagnosis not present

## 2018-08-02 DIAGNOSIS — I493 Ventricular premature depolarization: Secondary | ICD-10-CM

## 2018-08-02 NOTE — Progress Notes (Signed)
Cardiology Office Note:    Date:  08/02/2018   ID:  [name], DOB [birth year], MRN [id number]  PCP:  Barbie Banner, MD  Cardiologist:  Kristeen Miss, MD  Referring MD: Barbie Banner, MD   Chief Complaint  Patient presents with  . Follow-up    History of Present Illness:    [name] is a 67 y.o. female with a past medical history significant for Hypertension, obesity and premature ventricular contractions.  She was last seen in the office on 06/29/2017 by Dr. Elease Hashimoto.  It was noted that she has had a reported allergy to Toprol-XL and she was switched to metoprolol tartrate which was working better.  She is here today for annual follow-up with her husband. She is just getting over a URI and antibiotics. She feels that her palpitations have resolved after the antibiotics. Her BP is always up when she comes to our office that she relates to the drive in from [place].    No chest pain, shortness of breath, lightheadedness, syncope.  Past Medical History:  Diagnosis Date  . Exogenous obesity   . HTN (hypertension)   . Hypercholesteremia   . PVC's (premature ventricular contractions)     Past Surgical History:  Procedure Laterality Date  . BREAST EXCISIONAL BIOPSY Right 40 years ago   benign  . COLON SURGERY  07/08/11   emergency colon surgery   . hysterectomy - unknown type    . lap resection of perforated diverticulum  07/08/2011   drainage of intraabdominal abscess    Current Medications: Current Meds  Medication Sig  . Acetaminophen (TYLENOL PO) Take 250 mg by mouth 2 (two) times daily as needed (pain).   Marland Kitchen amLODipine (NORVASC) 5 MG tablet Take 2.5 mg by mouth daily.  . metoprolol tartrate (LOPRESSOR) 50 MG tablet Take 1 tablet (50 mg total) by mouth 2 (two) times daily. Please keep upcoming appt in November for future refills. Thank you     Allergies:   Diovan [valsartan]; Amoxicillin; Augmentin [amoxicillin-pot clavulanate]; Hctz [hydrochlorothiazide];  Zithromax [azithromycin]; Albuterol; Cephalexin; Diphenhydramine hcl; Fexofenadine; Penicillins; Toprol xl [metoprolol tartrate]; Sulfa antibiotics; Sulfacetamide sodium; and Sulfasalazine   Social History   Socioeconomic History  . Marital status: Married    Spouse name: Not on file  . Number of children: Not on file  . Years of education: Not on file  . Highest education level: Not on file  Occupational History  . Not on file  Social Needs  . Financial resource strain: Not on file  . Food insecurity:    Worry: Not on file    Inability: Not on file  . Transportation needs:    Medical: Not on file    Non-medical: Not on file  Tobacco Use  . Smoking status: Never Smoker  . Smokeless tobacco: Never Used  Substance and Sexual Activity  . Alcohol use: No  . Drug use: No  . Sexual activity: Not on file  Lifestyle  . Physical activity:    Days per week: Not on file    Minutes per session: Not on file  . Stress: Not on file  Relationships  . Social connections:    Talks on phone: Not on file    Gets together: Not on file    Attends religious service: Not on file    Active member of club or organization: Not on file    Attends meetings of clubs or organizations: Not on file    Relationship status: Not  on file  Other Topics Concern  . Not on file  Social History Narrative  . Not on file     Family History: The patient's family history includes Breast cancer in her mother; Coronary artery disease in her father; Heart disease in her father; Hypertension in her father. ROS:   Please see the history of present illness.     All other systems reviewed and are negative.  EKGs/Labs/Other Studies Reviewed:    EKG:  EKG is  ordered today.  The ekg ordered today demonstrates normal sinus rhythm with no abnormalities  Recent Labs: No results found for requested labs within last 8760 hours.   Recent Lipid Panel    Component Value Date/Time   CHOL 164 11/27/2014 1204   TRIG  173.0 (H) 11/27/2014 1204   HDL 38.10 (L) 11/27/2014 1204   CHOLHDL 4 11/27/2014 1204   VLDL 34.6 11/27/2014 1204   LDLCALC 91 11/27/2014 1204   LDLDIRECT 158.3 03/09/2011 0837    Physical Exam:    VS:  BP (!) 168/88, recheck-138/78  Pulse 73   Ht 5' 7.5" (1.715 m)   Wt 232 lb (105.2 kg)   SpO2 95%   BMI 35.80 kg/m     Wt Readings from Last 3 Encounters:  08/02/18 232 lb (105.2 kg)  06/29/17 231 lb 12.8 oz (105.1 kg)  05/23/16 233 lb 12.8 oz (106.1 kg)     Physical Exam  Constitutional: She is oriented to person, place, and time. She appears well-developed and well-nourished. No distress.  HENT:  Head: Normocephalic and atraumatic.  Neck: Normal range of motion. Neck supple. No JVD present.  Cardiovascular: Normal rate, regular rhythm, normal heart sounds and intact distal pulses. Exam reveals no gallop and no friction rub.  No murmur heard. Pulmonary/Chest: Effort normal and breath sounds normal. No respiratory distress. She has no wheezes. She has no rales.  Abdominal: Soft. Bowel sounds are normal.  Musculoskeletal: She exhibits no edema or deformity.  Neurological: She is alert and oriented to person, place, and time.  Skin: Skin is warm and dry.  Psychiatric: She has a normal mood and affect. Her behavior is normal. Judgment and thought content normal.  Vitals reviewed.   ASSESSMENT:    1. Essential hypertension   2. PVC's (premature ventricular contractions)    PLAN:    In order of problems listed above:  Hypertension: Currently treated with amlodipine 2.5 mg and metoprolol tartrate 50 mg twice daily.  Blood pressure is always elevated when she comes to our office due to the long drive. Rechecked 138/78.  She was unable to tolerate amlodipine 5 mg in the past due to dizziness in the mornings. Okay with 2.5 mg. I advised her to monitor her blood pressure at home occasionally and notify us if her blood pressure is running over 130/80 consistently.  She watches her  sodium intake.  Information on DASH diet provided.   PVCs: Has occ palpitations, but she has not noticed any in several weeks after treatment for URI with antibiotics.   Pt may want to been seen in [place] due the drive. She will think about it.    Medication Adjustments/Labs and Tests Ordered: Current medicines are reviewed at length with the patient today.  Concerns regarding medicines are outlined above. Labs and tests ordered and medication changes are outlined in the patient instructions below:  Patient Instructions  Medication Instructions:  Your physician recommends that you continue on your current medications as directed. Please refer to the  Current Medication list given to you today.  If you need a refill on your cardiac medications before your next appointment, please call your pharmacy.   Lab work: None  If you have labs (blood work) drawn today and your tests are completely normal, you will receive your results only by: Marland Kitchen MyChart Message (if you have MyChart) OR . A paper copy in the mail If you have any lab test that is abnormal or we need to change your treatment, we will call you to review the results.  Testing/Procedures: None  Follow-Up: At [facility], you and your health needs are our priority.  As part of our continuing mission to provide you with exceptional heart care, we have created designated Provider Care Teams.  These Care Teams include your primary Cardiologist (physician) and Advanced Practice Providers (APPs -  Physician Assistants and Nurse Practitioners) who all work together to provide you with the care you need, when you need it. You will need a follow up appointment in:  12 months.  Please call our office 2 months in advance to schedule this appointment.  You may see Kristeen Miss, MD or one of the following Advanced Practice Providers on your designated Care Team: Tereso Newcomer, PA-C [place], [place] . Berton Bon, NP  Any Other Special  Instructions Will Be Listed Below (If Applicable).  DASH Eating Plan DASH stands for "Dietary Approaches to Stop Hypertension." The DASH eating plan is a healthy eating plan that has been shown to reduce high blood pressure (hypertension). It may also reduce your risk for type 2 diabetes, heart disease, and stroke. The DASH eating plan may also help with weight loss. What are tips for following this plan? General guidelines  Avoid eating more than 2,300 mg (milligrams) of salt (sodium) a day. If you have hypertension, you may need to reduce your sodium intake to 1,500 mg a day.  Limit alcohol intake to no more than 1 drink a day for nonpregnant women and 2 drinks a day for men. One drink equals 12 oz of beer, 5 oz of wine, or 1 oz of hard liquor.  Work with your health care provider to maintain a healthy body weight or to lose weight. Ask what an ideal weight is for you.  Get at least 30 minutes of exercise that causes your heart to beat faster (aerobic exercise) most days of the week. Activities may include walking, swimming, or biking.  Work with your health care provider or diet and nutrition specialist (dietitian) to adjust your eating plan to your individual calorie needs. Reading food labels  Check food labels for the amount of sodium per serving. Choose foods with less than 5 percent of the Daily Value of sodium. Generally, foods with less than 300 mg of sodium per serving fit into this eating plan.  To find whole grains, look for the word "whole" as the first word in the ingredient list. Shopping  Buy products labeled as "low-sodium" or "no salt added."  Buy fresh foods. Avoid canned foods and premade or frozen meals. Cooking  Avoid adding salt when cooking. Use salt-free seasonings or herbs instead of table salt or sea salt. Check with your health care provider or pharmacist before using salt substitutes.  Do not fry foods. Cook foods using healthy methods such as baking,  boiling, grilling, and broiling instead.  Cook with heart-healthy oils, such as olive, canola, soybean, or sunflower oil. Meal planning   Eat a balanced diet that includes: ? 5  or more servings of fruits and vegetables each day. At each meal, try to fill half of your plate with fruits and vegetables. ? Up to 6-8 servings of whole grains each day. ? Less than 6 oz of lean meat, poultry, or fish each day. A 3-oz serving of meat is about the same size as a deck of cards. One egg equals 1 oz. ? 2 servings of low-fat dairy each day. ? A serving of nuts, seeds, or beans 5 times each week. ? Heart-healthy fats. Healthy fats called Omega-3 fatty acids are found in foods such as flaxseeds and coldwater fish, like sardines, salmon, and mackerel.  Limit how much you eat of the following: ? Canned or prepackaged foods. ? Food that is high in trans fat, such as fried foods. ? Food that is high in saturated fat, such as fatty meat. ? Sweets, desserts, sugary drinks, and other foods with added sugar. ? Full-fat dairy products.  Do not salt foods before eating.  Try to eat at least 2 vegetarian meals each week.  Eat more home-cooked food and less restaurant, buffet, and fast food.  When eating at a restaurant, ask that your food be prepared with less salt or no salt, if possible. What foods are recommended? The items listed may not be a complete list. Talk with your dietitian about what dietary choices are best for you. Grains Whole-grain or whole-wheat bread. Whole-grain or whole-wheat pasta. Brown rice. Orpah Cobb. Bulgur. Whole-grain and low-sodium cereals. Pita bread. Low-fat, low-sodium crackers. Whole-wheat flour tortillas. Vegetables Fresh or frozen vegetables (raw, steamed, roasted, or grilled). Low-sodium or reduced-sodium tomato and vegetable juice. Low-sodium or reduced-sodium tomato sauce and tomato paste. Low-sodium or reduced-sodium canned vegetables. Fruits All fresh, dried, or  frozen fruit. Canned fruit in natural juice (without added sugar). Meat and other protein foods Skinless chicken or [place]. Ground chicken or [place]. Pork with fat trimmed off. Fish and seafood. Egg whites. Dried beans, peas, or lentils. Unsalted nuts, nut butters, and seeds. Unsalted canned beans. Lean cuts of beef with fat trimmed off. Low-sodium, lean deli meat. Dairy Low-fat (1%) or fat-free (skim) milk. Fat-free, low-fat, or reduced-fat cheeses. Nonfat, low-sodium ricotta or cottage cheese. Low-fat or nonfat yogurt. Low-fat, low-sodium cheese. Fats and oils Soft margarine without trans fats. Vegetable oil. Low-fat, reduced-fat, or light mayonnaise and salad dressings (reduced-sodium). Canola, safflower, olive, soybean, and sunflower oils. Avocado. Seasoning and other foods Herbs. Spices. Seasoning mixes without salt. Unsalted popcorn and pretzels. Fat-free sweets. What foods are not recommended? The items listed may not be a complete list. Talk with your dietitian about what dietary choices are best for you. Grains Baked goods made with fat, such as croissants, muffins, or some breads. Dry pasta or rice meal packs. Vegetables Creamed or fried vegetables. Vegetables in a cheese sauce. Regular canned vegetables (not low-sodium or reduced-sodium). Regular canned tomato sauce and paste (not low-sodium or reduced-sodium). Regular tomato and vegetable juice (not low-sodium or reduced-sodium). Rosita Fire. Olives. Fruits Canned fruit in a light or heavy syrup. Fried fruit. Fruit in cream or butter sauce. Meat and other protein foods Fatty cuts of meat. Ribs. Fried meat. Tomasa Blase. Sausage. Bologna and other processed lunch meats. Salami. Fatback. Hotdogs. Bratwurst. Salted nuts and seeds. Canned beans with added salt. Canned or smoked fish. Whole eggs or egg yolks. Chicken or [place] with skin. Dairy Whole or 2% milk, cream, and half-and-half. Whole or full-fat cream cheese. Whole-fat or sweetened yogurt.  Full-fat cheese. Nondairy creamers. Whipped toppings. Processed cheese  and cheese spreads. Fats and oils Butter. Stick margarine. Lard. Shortening. Ghee. Bacon fat. Tropical oils, such as coconut, palm kernel, or palm oil. Seasoning and other foods Salted popcorn and pretzels. Onion salt, garlic salt, seasoned salt, table salt, and sea salt. Worcestershire sauce. Tartar sauce. Barbecue sauce. Teriyaki sauce. Soy sauce, including reduced-sodium. Steak sauce. Canned and packaged gravies. Fish sauce. Oyster sauce. Cocktail sauce. Horseradish that you find on the shelf. Ketchup. Mustard. Meat flavorings and tenderizers. Bouillon cubes. Hot sauce and Tabasco sauce. Premade or packaged marinades. Premade or packaged taco seasonings. Relishes. Regular salad dressings. Where to find more information:  National Heart, Lung, and Blood Institute: PopSteam.is  American Heart Association: www.heart.org Summary  The DASH eating plan is a healthy eating plan that has been shown to reduce high blood pressure (hypertension). It may also reduce your risk for type 2 diabetes, heart disease, and stroke.  With the DASH eating plan, you should limit salt (sodium) intake to 2,300 mg a day. If you have hypertension, you may need to reduce your sodium intake to 1,500 mg a day.  When on the DASH eating plan, aim to eat more fresh fruits and vegetables, whole grains, lean proteins, low-fat dairy, and heart-healthy fats.  Work with your health care provider or diet and nutrition specialist (dietitian) to adjust your eating plan to your individual calorie needs. This information is not intended to replace advice given to you by your health care provider. Make sure you discuss any questions you have with your health care provider. Document Released: 09/01/2011 Document Revised: 09/05/2016 Document Reviewed: 09/05/2016 Elsevier Interactive Patient Education  [employer].       Signed, Berton Bon, NP    08/02/2018 9:54 AM    [facility]

## 2018-08-02 NOTE — Patient Instructions (Signed)
Medication Instructions:  Your physician recommends that you continue on your current medications as directed. Please refer to the Current Medication list given to you today.  If you need a refill on your cardiac medications before your next appointment, please call your pharmacy.   Lab work: None  If you have labs (blood work) drawn today and your tests are completely normal, you will receive your results only by: Marland Kitchen MyChart Message (if you have MyChart) OR . A paper copy in the mail If you have any lab test that is abnormal or we need to change your treatment, we will call you to review the results.  Testing/Procedures: None  Follow-Up: At [facility], you and your health needs are our priority.  As part of our continuing mission to provide you with exceptional heart care, we have created designated Provider Care Teams.  These Care Teams include your primary Cardiologist (physician) and Advanced Practice Providers (APPs -  Physician Assistants and Nurse Practitioners) who all work together to provide you with the care you need, when you need it. You will need a follow up appointment in:  12 months.  Please call our office 2 months in advance to schedule this appointment.  You may see Kristeen Miss, MD or one of the following Advanced Practice Providers on your designated Care Team: Tereso Newcomer, PA-C [place], [place] . Berton Bon, NP  Any Other Special Instructions Will Be Listed Below (If Applicable).  DASH Eating Plan DASH stands for "Dietary Approaches to Stop Hypertension." The DASH eating plan is a healthy eating plan that has been shown to reduce high blood pressure (hypertension). It may also reduce your risk for type 2 diabetes, heart disease, and stroke. The DASH eating plan may also help with weight loss. What are tips for following this plan? General guidelines  Avoid eating more than 2,300 mg (milligrams) of salt (sodium) a day. If you have hypertension, you may need  to reduce your sodium intake to 1,500 mg a day.  Limit alcohol intake to no more than 1 drink a day for nonpregnant women and 2 drinks a day for men. One drink equals 12 oz of beer, 5 oz of wine, or 1 oz of hard liquor.  Work with your health care provider to maintain a healthy body weight or to lose weight. Ask what an ideal weight is for you.  Get at least 30 minutes of exercise that causes your heart to beat faster (aerobic exercise) most days of the week. Activities may include walking, swimming, or biking.  Work with your health care provider or diet and nutrition specialist (dietitian) to adjust your eating plan to your individual calorie needs. Reading food labels  Check food labels for the amount of sodium per serving. Choose foods with less than 5 percent of the Daily Value of sodium. Generally, foods with less than 300 mg of sodium per serving fit into this eating plan.  To find whole grains, look for the word "whole" as the first word in the ingredient list. Shopping  Buy products labeled as "low-sodium" or "no salt added."  Buy fresh foods. Avoid canned foods and premade or frozen meals. Cooking  Avoid adding salt when cooking. Use salt-free seasonings or herbs instead of table salt or sea salt. Check with your health care provider or pharmacist before using salt substitutes.  Do not fry foods. Cook foods using healthy methods such as baking, boiling, grilling, and broiling instead.  Cook with heart-healthy oils, such as  olive, canola, soybean, or sunflower oil. Meal planning   Eat a balanced diet that includes: ? 5 or more servings of fruits and vegetables each day. At each meal, try to fill half of your plate with fruits and vegetables. ? Up to 6-8 servings of whole grains each day. ? Less than 6 oz of lean meat, poultry, or fish each day. A 3-oz serving of meat is about the same size as a deck of cards. One egg equals 1 oz. ? 2 servings of low-fat dairy each day. ? A  serving of nuts, seeds, or beans 5 times each week. ? Heart-healthy fats. Healthy fats called Omega-3 fatty acids are found in foods such as flaxseeds and coldwater fish, like sardines, salmon, and mackerel.  Limit how much you eat of the following: ? Canned or prepackaged foods. ? Food that is high in trans fat, such as fried foods. ? Food that is high in saturated fat, such as fatty meat. ? Sweets, desserts, sugary drinks, and other foods with added sugar. ? Full-fat dairy products.  Do not salt foods before eating.  Try to eat at least 2 vegetarian meals each week.  Eat more home-cooked food and less restaurant, buffet, and fast food.  When eating at a restaurant, ask that your food be prepared with less salt or no salt, if possible. What foods are recommended? The items listed may not be a complete list. Talk with your dietitian about what dietary choices are best for you. Grains Whole-grain or whole-wheat bread. Whole-grain or whole-wheat pasta. Brown rice. Modena Morrow. Bulgur. Whole-grain and low-sodium cereals. Pita bread. Low-fat, low-sodium crackers. Whole-wheat flour tortillas. Vegetables Fresh or frozen vegetables (raw, steamed, roasted, or grilled). Low-sodium or reduced-sodium tomato and vegetable juice. Low-sodium or reduced-sodium tomato sauce and tomato paste. Low-sodium or reduced-sodium canned vegetables. Fruits All fresh, dried, or frozen fruit. Canned fruit in natural juice (without added sugar). Meat and other protein foods Skinless chicken or [place]. Ground chicken or [place]. Pork with fat trimmed off. Fish and seafood. Egg whites. Dried beans, peas, or lentils. Unsalted nuts, nut butters, and seeds. Unsalted canned beans. Lean cuts of beef with fat trimmed off. Low-sodium, lean deli meat. Dairy Low-fat (1%) or fat-free (skim) milk. Fat-free, low-fat, or reduced-fat cheeses. Nonfat, low-sodium ricotta or cottage cheese. Low-fat or nonfat yogurt. Low-fat,  low-sodium cheese. Fats and oils Soft margarine without trans fats. Vegetable oil. Low-fat, reduced-fat, or light mayonnaise and salad dressings (reduced-sodium). Canola, safflower, olive, soybean, and sunflower oils. Avocado. Seasoning and other foods Herbs. Spices. Seasoning mixes without salt. Unsalted popcorn and pretzels. Fat-free sweets. What foods are not recommended? The items listed may not be a complete list. Talk with your dietitian about what dietary choices are best for you. Grains Baked goods made with fat, such as croissants, muffins, or some breads. Dry pasta or rice meal packs. Vegetables Creamed or fried vegetables. Vegetables in a cheese sauce. Regular canned vegetables (not low-sodium or reduced-sodium). Regular canned tomato sauce and paste (not low-sodium or reduced-sodium). Regular tomato and vegetable juice (not low-sodium or reduced-sodium). Angie Fava. Olives. Fruits Canned fruit in a light or heavy syrup. Fried fruit. Fruit in cream or butter sauce. Meat and other protein foods Fatty cuts of meat. Ribs. Fried meat. Berniece Salines. Sausage. Bologna and other processed lunch meats. Salami. Fatback. Hotdogs. Bratwurst. Salted nuts and seeds. Canned beans with added salt. Canned or smoked fish. Whole eggs or egg yolks. Chicken or [place] with skin. Dairy Whole or 2% milk, cream, and  half-and-half. Whole or full-fat cream cheese. Whole-fat or sweetened yogurt. Full-fat cheese. Nondairy creamers. Whipped toppings. Processed cheese and cheese spreads. Fats and oils Butter. Stick margarine. Lard. Shortening. Ghee. Bacon fat. Tropical oils, such as coconut, palm kernel, or palm oil. Seasoning and other foods Salted popcorn and pretzels. Onion salt, garlic salt, seasoned salt, table salt, and sea salt. Worcestershire sauce. Tartar sauce. Barbecue sauce. Teriyaki sauce. Soy sauce, including reduced-sodium. Steak sauce. Canned and packaged gravies. Fish sauce. Oyster sauce. Cocktail sauce.  Horseradish that you find on the shelf. Ketchup. Mustard. Meat flavorings and tenderizers. Bouillon cubes. Hot sauce and Tabasco sauce. Premade or packaged marinades. Premade or packaged taco seasonings. Relishes. Regular salad dressings. Where to find more information:  National Heart, Lung, and [place]: https://wilson-eaton.com/  American Heart Association: www.heart.org Summary  The DASH eating plan is a healthy eating plan that has been shown to reduce high blood pressure (hypertension). It may also reduce your risk for type 2 diabetes, heart disease, and stroke.  With the DASH eating plan, you should limit salt (sodium) intake to 2,300 mg a day. If you have hypertension, you may need to reduce your sodium intake to 1,500 mg a day.  When on the DASH eating plan, aim to eat more fresh fruits and vegetables, whole grains, lean proteins, low-fat dairy, and heart-healthy fats.  Work with your health care provider or diet and nutrition specialist (dietitian) to adjust your eating plan to your individual calorie needs. This information is not intended to replace advice given to you by your health care provider. Make sure you discuss any questions you have with your health care provider. Document Released: 09/01/2011 Document Revised: 09/05/2016 Document Reviewed: 09/05/2016 Elsevier Interactive Patient Education  [employer].

## 2018-08-28 ENCOUNTER — Other Ambulatory Visit: Payer: Self-pay | Admitting: Cardiovascular Disease

## 2018-09-28 ENCOUNTER — Other Ambulatory Visit: Payer: Self-pay | Admitting: Cardiovascular Disease

## 2018-09-28 MED ORDER — AMLODIPINE BESYLATE 5 MG PO TABS: 2.5000 mg | ORAL_TABLET | Freq: Every day | ORAL | 3 refills | Status: DC

## 2019-02-20 DIAGNOSIS — R12 Heartburn: Secondary | ICD-10-CM | POA: Insufficient documentation

## 2019-04-08 LAB — HM COLONOSCOPY

## 2019-08-14 ENCOUNTER — Telehealth: Payer: Self-pay | Admitting: Cardiovascular Disease

## 2019-08-14 MED ORDER — METOPROLOL TARTRATE 50 MG PO TABS: 50.0000 mg | ORAL_TABLET | Freq: Two times a day (BID) | ORAL | 0 refills | Status: DC

## 2019-08-14 NOTE — Telephone Encounter (Signed)
Pt's medication was sent to pt's pharmacy as requested. Confirmation received.  °

## 2019-08-14 NOTE — Telephone Encounter (Signed)
°*  STAT* If patient is at the pharmacy, call can be transferred to refill team.   1. Which medications need to be refilled? (please list name of each medication and dose if known) metoprolol tartrate (LOPRESSOR) 50 MG tablet  2. Which pharmacy/location (including street and city if local pharmacy) is medication to be sent to? [place], [place]  3. Do they need a 30 day or 90 day supply? 90 day

## 2019-08-21 ENCOUNTER — Other Ambulatory Visit: Payer: Self-pay | Admitting: Cardiovascular Disease

## 2019-09-23 ENCOUNTER — Other Ambulatory Visit: Payer: Self-pay | Admitting: Cardiovascular Disease

## 2019-09-24 ENCOUNTER — Encounter: Payer: Self-pay | Admitting: *Deleted

## 2019-09-24 ENCOUNTER — Other Ambulatory Visit: Payer: Self-pay

## 2019-09-24 MED ORDER — METOPROLOL TARTRATE 50 MG PO TABS: ORAL_TABLET | ORAL | 0 refills | Status: DC

## 2019-10-03 ENCOUNTER — Ambulatory Visit: Payer: Medicare Other | Admitting: Cardiology

## 2019-10-07 NOTE — Progress Notes (Signed)
Virtual Visit via Telephone Note   This visit type was conducted due to national recommendations for restrictions regarding the COVID-19 Pandemic (e.g. social distancing) in an effort to limit this patient's exposure and mitigate transmission in our community.  Due to her co-morbid illnesses, this patient is at least at moderate risk for complications without adequate follow up.  This format is felt to be most appropriate for this patient at this time.  The patient did not have access to video technology/had technical difficulties with video requiring transitioning to audio format only (telephone).  All issues noted in this document were discussed and addressed.  No physical exam could be performed with this format.  Please refer to the patient's chart for her  consent to telehealth for [facility].   Date:  10/08/2019   ID:  [name], DOB 1950-12-23, MRN [id number]  Patient Location: Home Provider Location: Office  PCP:  Christain Sacramento, MD  Cardiologist:  Mertie Moores, MD  Electrophysiologist:  None   Evaluation Performed:  Follow-Up Visit  Chief Complaint:  HTN  History of Present Illness:    [name] is a 69 y.o. female with a past medical history significant for Hypertension, obesity and premature ventricular contractions.  She was seen in the office on 06/29/2017 by Dr. Acie Fredrickson.  It was noted that she has had a reported allergy to Toprol-XL and she was switched to metoprolol tartrate which was working better.  Last visit 08/02/18  She is just getting over a URI and antibiotics. She feels that her palpitations have resolved after the antibiotics. Her BP is always up when she comes to our office that she relates to the drive in from [place].   Today she feels well no chest pain and no SOB.  Exercises by being active.  Eats healthy.  No complaints.  She stopped amlodipine in April and BP is controlled from 242P systolic to 536 or so.  She feels better off amlodipine, was  having headaches.  She had normal EGD and colonoscopy this year.  Her daughter was dx with breast cancer so she the pt is isolating to protect her daughter for any chance of COVID and plans to have vaccine.     The patient does not have symptoms concerning for COVID-19 infection (fever, chills, cough, or new shortness of breath).    Past Medical History:  Diagnosis Date  . Bronchitis 07/13/2018  . Exogenous obesity   . Gastroesophageal reflux disease without esophagitis 11/24/2015  . Hearing loss 11/24/2015  . HTN (hypertension)   . Hypercholesteremia   . PVC's (premature ventricular contractions)    Past Surgical History:  Procedure Laterality Date  . BREAST EXCISIONAL BIOPSY Right 40 years ago   benign  . COLON SURGERY  07/08/11   emergency colon surgery   . hysterectomy - unknown type    . lap resection of perforated diverticulum  07/08/2011   drainage of intraabdominal abscess     Current Meds  Medication Sig  . Acetaminophen (TYLENOL PO) Take 250 mg by mouth 2 (two) times daily as needed (pain).   Marland Kitchen doxycycline (VIBRAMYCIN) 50 MG capsule Take 50 mg by mouth 2 (two) times daily.  . metoprolol tartrate (LOPRESSOR) 50 MG tablet TAKE  (1)  TABLET TWICE A DAY.  Marland Kitchen omeprazole (PRILOSEC) 40 MG capsule Take 40 mg by mouth every morning.     Allergies:   Diovan [valsartan], Amoxicillin, Augmentin [amoxicillin-pot clavulanate], Hctz [hydrochlorothiazide], Zithromax [azithromycin], Albuterol, Cephalexin, Diphenhydramine hcl,  Fexofenadine, Penicillins, Toprol xl [metoprolol tartrate], Sulfa antibiotics, Sulfacetamide sodium, and Sulfasalazine   Social History   Tobacco Use  . Smoking status: Never Smoker  . Smokeless tobacco: Never Used  Substance Use Topics  . Alcohol use: No  . Drug use: No     Family Hx: The patient's family history includes Breast cancer in her mother; Coronary artery disease in her father; Heart disease in her father; Hypertension in her father.  ROS:     Please see the history of present illness.    General:no colds or fevers, no weight changes Skin:no rashes or ulcers HEENT:no blurred vision, no congestion CV:see HPI PUL:see HPI GI:no diarrhea constipation or melena, no indigestion GU:no hematuria, no dysuria MS:no joint pain, no claudication Neuro:no syncope, no lightheadedness Endo:no diabetes, no thyroid disease  All other systems reviewed and are negative.   Prior CV studies:   The following studies were reviewed today:  See above  Labs/Other Tests and Data Reviewed:    EKG:  An ECG dated 08/03/19 was personally reviewed today and demonstrated:  SR normal EKG  Recent Labs: No results found for requested labs within last 8760 hours.   Recent Lipid Panel Lab Results  Component Value Date/Time   CHOL 164 11/27/2014 12:04 PM   TRIG 173.0 (H) 11/27/2014 12:04 PM   HDL 38.10 (L) 11/27/2014 12:04 PM   CHOLHDL 4 11/27/2014 12:04 PM   LDLCALC 91 11/27/2014 12:04 PM   LDLDIRECT 158.3 03/09/2011 08:37 AM    Wt Readings from Last 3 Encounters:  08/02/18 232 lb (105.2 kg)  06/29/17 231 lb 12.8 oz (105.1 kg)  05/23/16 233 lb 12.8 oz (106.1 kg)     Objective:    Vital Signs:  BP 140/73   Pulse 78   Ht 5' 7.5" (1.715 m)   BMI 35.80 kg/m    VITAL SIGNS:  reviewed General NAD Pulmonary can speak in complete sentences without SOB.  Neuro A&O X 3 Psych:  Pleasant affect  ASSESSMENT & PLAN:    1. HTN off amlodipine and BP is controlled by her home reading.  continue BB. 2. PVCs rare.   COVID-19 Education: The signs and symptoms of COVID-19 were discussed with the patient and how to seek care for testing (follow up with PCP or arrange E-visit).  The importance of social distancing was discussed today.  Time:   Today, I have spent 8 minutes with the patient with telehealth technology discussing the above problems.     Medication Adjustments/Labs and Tests Ordered: Current medicines are reviewed at length with the  patient today.  Concerns regarding medicines are outlined above.   Tests Ordered: No orders of the defined types were placed in this encounter.   Medication Changes: No orders of the defined types were placed in this encounter.   Follow Up:  In Person in 1 year(s)  Signed, Nada Boozer, NP  10/08/2019 3:51 PM    [facility]

## 2019-10-08 ENCOUNTER — Telehealth: Payer: Self-pay

## 2019-10-08 ENCOUNTER — Encounter: Payer: Self-pay | Admitting: Cardiology

## 2019-10-08 ENCOUNTER — Telehealth (INDEPENDENT_AMBULATORY_CARE_PROVIDER_SITE_OTHER): Payer: Medicare PPO | Admitting: Cardiology

## 2019-10-08 ENCOUNTER — Other Ambulatory Visit: Payer: Self-pay

## 2019-10-08 VITALS — BP 140/73 | HR 78 | Ht 67.5 in

## 2019-10-08 DIAGNOSIS — I10 Essential (primary) hypertension: Secondary | ICD-10-CM | POA: Diagnosis not present

## 2019-10-08 DIAGNOSIS — I493 Ventricular premature depolarization: Secondary | ICD-10-CM

## 2019-10-08 NOTE — Telephone Encounter (Signed)
Spoke to pt who consented to phone conference with Alease Frame  Dorma Russell

## 2019-10-08 NOTE — Patient Instructions (Signed)
Medication Instructions:  Your physician recommends that you continue on your current medications as directed. Please refer to the Current Medication list given to you today.  *If you need a refill on your cardiac medications before your next appointment, please call your pharmacy*  Lab Work: None ordered  If you have labs (blood work) drawn today and your tests are completely normal, you will receive your results only by: Marland Kitchen MyChart Message (if you have MyChart) OR . A paper copy in the mail If you have any lab test that is abnormal or we need to change your treatment, we will call you to review the results.  Testing/Procedures: None ordered  Follow-Up: At [facility], you and your health needs are our priority.  As part of our continuing mission to provide you with exceptional heart care, we have created designated Provider Care Teams.  These Care Teams include your primary Cardiologist (physician) and Advanced Practice Providers (APPs -  Physician Assistants and Nurse Practitioners) who all work together to provide you with the care you need, when you need it.  Your next appointment:   12 month(s)  The format for your next appointment:   In Person  Provider:   You may see Kristeen Miss, MD or one of the following Advanced Practice Providers on your designated Care Team:    Tereso Newcomer, PA-C  [place], [place]  Berton Bon, NP   Other Instructions

## 2019-12-19 ENCOUNTER — Other Ambulatory Visit: Payer: Self-pay

## 2019-12-19 ENCOUNTER — Other Ambulatory Visit: Payer: Self-pay | Admitting: Cardiovascular Disease

## 2019-12-19 MED ORDER — METOPROLOL TARTRATE 50 MG PO TABS: ORAL_TABLET | ORAL | 2 refills | Status: DC

## 2019-12-19 NOTE — Telephone Encounter (Signed)
Pt's medication was sent to pt's pharmacy as requested. Confirmation received.  °

## 2020-08-24 ENCOUNTER — Encounter: Payer: Self-pay | Admitting: Cardiovascular Disease

## 2020-09-14 ENCOUNTER — Telehealth: Payer: Self-pay | Admitting: Cardiovascular Disease

## 2020-09-14 MED ORDER — METOPROLOL TARTRATE 50 MG PO TABS: 50.0000 mg | ORAL_TABLET | Freq: Two times a day (BID) | ORAL | 0 refills | Status: DC

## 2020-09-14 NOTE — Telephone Encounter (Signed)
   *  STAT* If patient is at the pharmacy, call can be transferred to refill team.   1. Which medications need to be refilled? (please list name of each medication and dose if known)   metoprolol tartrate (LOPRESSOR) 50 MG tablet    2. Which pharmacy/location (including street and city if local pharmacy) is medication to be sent to? [facility] [place], [place] - [address]  3. Do they need a 30 day or 90 day supply? 90 days

## 2020-09-14 NOTE — Telephone Encounter (Signed)
Pt's medication was sent to pt's pharmacy as requested. Confirmation received.  °

## 2020-10-08 NOTE — Progress Notes (Signed)
CARDIOLOGY OFFICE NOTE  Date:  10/22/2020    [name] Date of Birth: January 24, 1951 Medical Record #[id number]  PCP:  Barbie Banner, MD  Cardiologist:  Nahser  Chief Complaint  Patient presents with  . Follow-up    1 year visit - seen for Dr. Elease Hashimoto    History of Present Illness: [name] is a 70 y.o. female who presents today for a one year/work in visit.  Seen for Dr. Elease Hashimoto.   She has a history of HTN, obesity and premature ventricular contractions. No reported CAD noted.    She was seen in the office on 06/29/2017 by Dr. Elease Hashimoto.  It was noted that she has had a reported allergy to Toprol-XL and she was switched to metoprolol tartrate which was working better.  Last seen by virtual visit a year ago by Vernona Rieger - felt to be doing ok.   Looks to have pending echo and carotids for tomorrow after calling earlier this month with amaurosis fugax. Aspirin was started.   Comes in today. Here with her husband today. No chest pain. Has had some shortness of breath. Noted phone call earlier this month about problems with her vision - this seems to actually be a new issue but has continued. She is for studies tomorrow up at [facility]. She has questions about whether she should be taking antibiotics prior to procedures. She says her chart indicates that she has had a viral heart infection in the past and that Dr. Tally Due told her this in the past - I do not see this in her chart.  She would also like to transition over to the [place] office due to where they live. It causes too much stress for them to come here. She has been having chronic issues with her eyes - some migraines - but blurring over the top part of the right eye - getting little lightheaded as well - this actually starts "as a sick feeling down in her belly" that then moves up to her head. Notes will have with stress/irriated or rushing. For years she has had no energy. She has chronic fatigue. She admits she is  overweight. No exercise - just says she can only do the necessities. She will have a considerable amount of indigestion with belching and burping. This is better with Prilosec and was worse with aspirin. She has chronic palpitations but admits she ignores this but this does not seem to cause her lightheadedness. She was taken off Norvasc about 2 years ago - she thought this made her feel better overall but "not totally". BP is high here today - not really checking at home but does have a cuff. Getting some take out/delivery. Tries to get french fries without salt. She is on chronic Doxycycline for rosacea.   Past Medical History:  Diagnosis Date  . Bronchitis 07/13/2018  . Exogenous obesity   . Gastroesophageal reflux disease without esophagitis 11/24/2015  . Hearing loss 11/24/2015  . HTN (hypertension)   . Hypercholesteremia   . PVC's (premature ventricular contractions)     Past Surgical History:  Procedure Laterality Date  . BREAST EXCISIONAL BIOPSY Right 40 years ago   benign  . COLON SURGERY  07/08/11   emergency colon surgery   . hysterectomy - unknown type    . lap resection of perforated diverticulum  07/08/2011   drainage of intraabdominal abscess     Medications: Current Meds  Medication Sig  . Acetaminophen (TYLENOL PO)  Take 250 mg by mouth 2 (two) times daily as needed (pain).   Marland Kitchen amLODipine (NORVASC) 5 MG tablet Take 1 tablet (5 mg total) by mouth daily.  Marland Kitchen aspirin EC 81 MG tablet Take 1 tablet (81 mg total) by mouth daily. Swallow whole.  . clindamycin (CLEOCIN) 150 MG capsule TAKE 4 CAPSULES 1 HOUREPRIOR TO DENTAL APPOINTMENT  . doxycycline (VIBRAMYCIN) 50 MG capsule Take 50 mg by mouth 2 (two) times daily.  . metoprolol tartrate (LOPRESSOR) 100 MG tablet Take 1 tablet (100 mg total) by mouth 2 (two) times daily.  Marland Kitchen omeprazole (PRILOSEC) 40 MG capsule Take 40 mg by mouth every morning.  . [DISCONTINUED] metoprolol tartrate (LOPRESSOR) 50 MG tablet Take 1 tablet (50 mg  total) by mouth 2 (two) times daily. Please keep upcoming appt in January 2022 for future refills. Thank you     Allergies: Allergies  Allergen Reactions  . Diovan [Valsartan] Anaphylaxis    weakness weakness  . Amoxicillin Hives and Other (See Comments)    itching itching  . Augmentin [Amoxicillin-Pot Clavulanate] Itching and Other (See Comments)    unknown  . Hctz [Hydrochlorothiazide] Other (See Comments)    weakness weakness  . Zithromax [Azithromycin] Itching and Other (See Comments)    unknown  . Albuterol     Other reaction(s): Lethargy (intolerance) Fatigue and depression  . Cephalexin Other (See Comments)    Severe acid reflux  . Diphenhydramine Hcl Other (See Comments)    Mental status changes  . Fexofenadine Other (See Comments)    unknown  . Penicillins Other (See Comments)    unknown unknown  . Toprol Xl [Metoprolol Tartrate]     Stinging all over body  . Sulfa Antibiotics Itching    itching  . Sulfacetamide Sodium Itching    itching  . Sulfasalazine Itching    itching    Social History: The patient  reports that she has never smoked. She has never used smokeless tobacco. She reports that she does not drink alcohol and does not use drugs.   Family History: The patient's family history includes Breast cancer in her mother; Coronary artery disease in her father; Heart disease in her father; Hypertension in her father.   Review of Systems: Please see the history of present illness.   All other systems are reviewed and negative.   Physical Exam: VS:  BP (!) 170/92   Pulse 79   Ht 5' 7.5" (1.715 m)   Wt 239 lb 3.2 oz (108.5 kg)   SpO2 94%   BMI 36.91 kg/m  .  BMI Body mass index is 36.91 kg/m.  Wt Readings from Last 3 Encounters:  10/22/20 239 lb 3.2 oz (108.5 kg)  08/02/18 232 lb (105.2 kg)  06/29/17 231 lb 12.8 oz (105.1 kg)   BP was 210/100 in the right arm by me and 220/120 in the left.   General: Alert and in no acute distress. Morbidly  obese.  Little anxious.   Neck: No bruits.  Cardiac: Regular rate and rhythm. No murmurs, rubs, or gallops. No edema.  Respiratory:  Lungs are clear to auscultation bilaterally with normal work of breathing.  GI: Soft and nontender.  MS: No deformity or atrophy. Gait and ROM intact.  Skin: Warm and dry. Color is normal.  Neuro:  Strength and sensation are intact and no gross focal deficits noted.  Psych: Alert, appropriate and with normal affect.   LABORATORY DATA:  EKG:  EKG is ordered today.  Personally reviewed by  me. This demonstrates NSR.  Lab Results  Component Value Date   WBC 9.3 07/09/2011   HGB 12.4 07/09/2011   HCT 37.2 07/09/2011   PLT 193 07/09/2011   GLUCOSE 98 11/27/2014   CHOL 164 11/27/2014   TRIG 173.0 (H) 11/27/2014   HDL 38.10 (L) 11/27/2014   LDLDIRECT 158.3 03/09/2011   LDLCALC 91 11/27/2014   ALT 25 11/27/2014   AST 18 11/27/2014   NA 139 11/27/2014   K 4.0 11/27/2014   CL 105 11/27/2014   CREATININE 0.62 11/27/2014   BUN 15 11/27/2014   CO2 31 11/27/2014     BNP (last 3 results) No results for input(s): BNP in the last 8760 hours.  ProBNP (last 3 results) No results for input(s): PROBNP in the last 8760 hours.   Other Studies Reviewed Today:      ASSESSMENT & PLAN:     1. Marked HTN - restarting Norvasc 5 mg a day and increasing Lopressor to 100 mg BID. She has several allergies noted but she is not able to confirm these. We will check lab today. See back early next week. She is to start monitoring her BP and keep a record for [place] and bring to her visit along with her cuff next week. Would hold on attempts at exercise until we can get her BP down.   2. Vision issues/lightheadedness - getting doppler and echo tomorrow.   3. 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  4. Obesity  5. Palpitations with past PVCs noted. On beta blocker.   Current medicines are reviewed with the patient today.  The patient does not have concerns regarding  medicines other than what has been noted above.  The following changes have been made:  See above.  Labs/ tests ordered today include:    Orders Placed This Encounter  Procedures  . Basic metabolic panel  . CBC  . Hepatic function panel  . Lipid panel  . TSH  . EKG 12-Lead     Disposition:   FU with Dr. Elease Hashimoto next week. Her husband sees Dr. Diona Browner - she would like to transition there and we can discuss this further on return. I do not see a reason for SBE - will see how her echo turns out. Further disposition to follow.    Patient is agreeable to this plan and will call if any problems develop in the interim.   SignedNorma Fredrickson, NP  10/22/2020 12:10 PM  [facility] [address] [place], [place]  [address] Phone: [phone] Fax: [phone]

## 2020-10-16 ENCOUNTER — Telehealth: Payer: Self-pay | Admitting: *Deleted

## 2020-10-16 DIAGNOSIS — I10 Essential (primary) hypertension: Secondary | ICD-10-CM

## 2020-10-16 DIAGNOSIS — G453 Amaurosis fugax: Secondary | ICD-10-CM

## 2020-10-16 MED ORDER — ASPIRIN EC 81 MG PO TBEC: 81.0000 mg | DELAYED_RELEASE_TABLET | Freq: Every day | ORAL | 3 refills | Status: DC

## 2020-10-16 NOTE — Telephone Encounter (Signed)
I spoke with patient and gave her information from Dr Elease Hashimoto.  She would like to have testing done in [place].  Patient is already scheduled to see Norma Fredrickson, NP on 10/23/19. Patient reports she had been on ASA 81 mg in the past but it caused her hemorrhoids to bleed.  She will start ASA now and monitor for any bleeding issues.

## 2020-10-16 NOTE — Telephone Encounter (Signed)
Nahser, Deloris Ping, MD  P Cv Div Ch St Triage Dondra Spry will also need a cardiology visit with an APP in the next several weeks  Please start her on ASA 81 mg a day .  Thanks

## 2020-10-16 NOTE — Telephone Encounter (Signed)
-----   Message from Vesta Mixer, MD sent at 10/15/2020  5:02 PM EST ----- Pt is having symptoms of Amaurosis fugax Please order a carotid duplex scan and echocardiogram for soon as possible. Thanks  Phil   ----- Message ----- From: Lennie Odor Sent: 10/15/2020   4:29 PM EST To: Vesta Mixer, MD

## 2020-10-22 ENCOUNTER — Ambulatory Visit: Payer: Medicare PPO | Admitting: Nurse Practitioner

## 2020-10-22 ENCOUNTER — Other Ambulatory Visit: Payer: Self-pay

## 2020-10-22 ENCOUNTER — Encounter: Payer: Self-pay | Admitting: Nurse Practitioner

## 2020-10-22 VITALS — BP 170/92 | HR 79 | Ht 67.5 in | Wt 239.2 lb

## 2020-10-22 DIAGNOSIS — I10 Essential (primary) hypertension: Secondary | ICD-10-CM | POA: Diagnosis not present

## 2020-10-22 DIAGNOSIS — I493 Ventricular premature depolarization: Secondary | ICD-10-CM

## 2020-10-22 MED ORDER — METOPROLOL TARTRATE 100 MG PO TABS: 100.0000 mg | ORAL_TABLET | Freq: Two times a day (BID) | ORAL | 3 refills | Status: DC

## 2020-10-22 MED ORDER — AMLODIPINE BESYLATE 5 MG PO TABS: 5.0000 mg | ORAL_TABLET | Freq: Every day | ORAL | 11 refills | Status: DC

## 2020-10-22 NOTE — Patient Instructions (Addendum)
After Visit Summary:  We will be checking the following labs today - BMET, CBC, HPF, Lipids and TSH  Medication Instructions:    Continue with your current medicines. BUT  I am increasing the Lopressor 100 mg twice a day  Start Norvasc 5 mg a day   If you need a refill on your cardiac medications before your next appointment, please call your pharmacy.     Testing/Procedures To Be Arranged:  Carotids and echo tomorrow as planned.   Follow-Up:   See Dr. Elease Hashimoto next week    At [facility], you and your health needs are our priority.  As part of our continuing mission to provide you with exceptional heart care, we have created designated Provider Care Teams.  These Care Teams include your primary Cardiologist (physician) and Advanced Practice Providers (APPs -  Physician Assistants and Nurse Practitioners) who all work together to provide you with the care you need, when you need it.  Special Instructions:  . Stay safe, wash your hands for at least 20 seconds and wear a mask when needed.  . It was good to talk with you today.  . Start monitoring your blood pressure - keep a diary - bring your cuff and your diary to your visit next week . Limit the salt/sodium intake.    Call the [facility] office at [phone] if you have any questions, problems or concerns.

## 2020-10-23 ENCOUNTER — Telehealth: Payer: Self-pay

## 2020-10-23 ENCOUNTER — Other Ambulatory Visit: Payer: Self-pay

## 2020-10-23 ENCOUNTER — Ambulatory Visit (HOSPITAL_BASED_OUTPATIENT_CLINIC_OR_DEPARTMENT_OTHER)
Admission: RE | Admit: 2020-10-23 | Discharge: 2020-10-23 | Disposition: A | Discharge status: Home / Self Care | Payer: Medicare PPO | Source: Ambulatory Visit | Attending: Cardiovascular Disease | Admitting: Cardiovascular Disease

## 2020-10-23 ENCOUNTER — Ambulatory Visit (HOSPITAL_COMMUNITY)
Admission: RE | Admit: 2020-10-23 | Discharge: 2020-10-23 | Disposition: A | Discharge status: Home / Self Care | Payer: Medicare PPO | Source: Ambulatory Visit | Attending: Cardiovascular Disease | Admitting: Cardiovascular Disease

## 2020-10-23 DIAGNOSIS — I10 Essential (primary) hypertension: Secondary | ICD-10-CM

## 2020-10-23 DIAGNOSIS — G453 Amaurosis fugax: Secondary | ICD-10-CM | POA: Insufficient documentation

## 2020-10-23 LAB — ECHOCARDIOGRAM COMPLETE
AR max vel: 2.17 cm2
AV Area VTI: 2.54 cm2
AV Area mean vel: 2.16 cm2
AV Mean grad: 6 mmHg
AV Peak grad: 11.1 mmHg
Ao pk vel: 1.67 m/s
Area-P 1/2: 3.11 cm2
S' Lateral: 4.1 cm

## 2020-10-23 LAB — LIPID PANEL
Chol/HDL Ratio: 4.5 ratio — ABNORMAL HIGH (ref 0.0–4.4)
Cholesterol, Total: 184 mg/dL (ref 100–199)
HDL: 41 mg/dL (ref >39)
LDL Chol Calc (NIH): 118 mg/dL — ABNORMAL HIGH (ref 0–99)
Triglycerides: 139 mg/dL (ref 0–149)
VLDL Cholesterol Cal: 25 mg/dL (ref 5–40)

## 2020-10-23 LAB — BASIC METABOLIC PANEL
BUN/Creatinine Ratio: 20 (ref 12–28)
BUN: 13 mg/dL (ref 8–27)
CO2: 25 mmol/L (ref 20–29)
Calcium: 9 mg/dL (ref 8.7–10.3)
Chloride: 104 mmol/L (ref 96–106)
Creatinine, Ser: 0.64 mg/dL (ref 0.57–1.00)
GFR calc Af Amer: 105 mL/min/{1.73_m2} (ref >59)
GFR calc non Af Amer: 91 mL/min/{1.73_m2} (ref >59)
Glucose: 104 mg/dL — ABNORMAL HIGH (ref 65–99)
Potassium: 4.5 mmol/L (ref 3.5–5.2)
Sodium: 143 mmol/L (ref 134–144)

## 2020-10-23 LAB — HEPATIC FUNCTION PANEL
ALT: 25 IU/L (ref 0–32)
AST: 17 IU/L (ref 0–40)
Albumin: 4.3 g/dL (ref 3.8–4.8)
Alkaline Phosphatase: 77 IU/L (ref 44–121)
Bilirubin Total: 0.6 mg/dL (ref 0.0–1.2)
Bilirubin, Direct: 0.15 mg/dL (ref 0.00–0.40)
Total Protein: 6.8 g/dL (ref 6.0–8.5)

## 2020-10-23 LAB — CBC
Hematocrit: 41.6 % (ref 34.0–46.6)
Hemoglobin: 14 g/dL (ref 11.1–15.9)
MCH: 28.9 pg (ref 26.6–33.0)
MCHC: 33.7 g/dL (ref 31.5–35.7)
MCV: 86 fL (ref 79–97)
Platelets: 255 10*3/uL (ref 150–450)
RBC: 4.84 x10E6/uL (ref 3.77–5.28)
RDW: 12.6 % (ref 11.7–15.4)
WBC: 8.7 10*3/uL (ref 3.4–10.8)

## 2020-10-23 LAB — TSH: TSH: 1.25 u[IU]/mL (ref 0.450–4.500)

## 2020-10-23 NOTE — Telephone Encounter (Signed)
Rn spoke with patient to discuss lab results,  and she reported that she checks her b/p this am and it was 156/94, pulse 70, hadn't taken am meds at this time; advised her to take her scheduled medications are prescribed and recheck her vital signs in a couple hours. Patient stated she understood recommendations and stated she would call with any concerns.

## 2020-10-23 NOTE — Progress Notes (Signed)
*  PRELIMINARY RESULTS* Echocardiogram 2D Echocardiogram has been performed.  Stacey Drain 10/23/2020, 3:42 PM

## 2020-10-23 NOTE — Telephone Encounter (Signed)
-----   Message from Rosalio Macadamia, NP sent at 10/23/2020  7:31 AM EST ----- Ok to report. Labs are stable - would continue on current regimen. See back next week as planned.

## 2020-10-25 ENCOUNTER — Encounter: Payer: Self-pay | Admitting: Cardiovascular Disease

## 2020-10-25 NOTE — Progress Notes (Signed)
Cardiology Office Note:    Date:  10/26/2020   ID:  [name], DOB Oct 21, 1950, MRN [id number]  PCP:  Barbie Banner, MD  Cardiologist:  Kristeen Miss, MD  , previous [name] patient   Referring MD: Barbie Banner, MD   Problem list 1. Essential hypertension 2. Premature ventricular contractions   Chief Complaint  Patient presents with  . Hypertension      Previous notes.    [name] is a 70 y.o. female with a hx of Hypertension, obesity, premature ventricular contractions. She was PVCs seen by Dr. Patty Mason. Thedore Mins her today for the first time.   She is seen with her husband , [name] who is also a patient of mine.  BP is typically normal at home.   Is always elevated at the office. .  Hates to drive to [place] from [place].  She is having to care for her mother .      No CP or dyspnea.  Developed an allergy to toprol XL.   Switched to Metoprolol tartrate which seems to work better Getting some exercise,  Knows that she needs to lose weight .   Has occasional palpitations.  When under stress.   Jan. 31, 2022: [name] is seen back today for follow upvisit I last saw her 4 years ago  Wt. Today is  234 lbs  She was seen by Norma Fredrickson, NP on Jan. 27, 2022 Echo has been ordered - will be done up on [place] office She would like to transfer her care to the [place] office since they live up there.  Her husband, [name] is seeing Dr. Diona Browner and she would like to see him also  BP is better after starting higher dose of metoprolol and starting amlodipine  5 mg a day .   Having lots of reflux issues.   Had symptoms of amurosis fugax - was seen By Dr. Ruffin Pyo. Carotid duplex was unremarkable.  Echo showed normal LV function,  She has grade 1 diastolic dysfunction.,  Small pericardial effusion .     Past Medical History:  Diagnosis Date  . Bronchitis 07/13/2018  . Exogenous obesity   . Gastroesophageal reflux disease without esophagitis 11/24/2015  .  Hearing loss 11/24/2015  . HTN (hypertension)   . Hypercholesteremia   . PVC's (premature ventricular contractions)     Past Surgical History:  Procedure Laterality Date  . BREAST EXCISIONAL BIOPSY Right 40 years ago   benign  . COLON SURGERY  07/08/11   emergency colon surgery   . hysterectomy - unknown type    . lap resection of perforated diverticulum  07/08/2011   drainage of intraabdominal abscess    Current Medications: Current Meds  Medication Sig  . Acetaminophen (TYLENOL PO) Take 250 mg by mouth 2 (two) times daily as needed (pain).   Marland Kitchen amLODipine (NORVASC) 5 MG tablet Take 1 tablet (5 mg total) by mouth daily.  Marland Kitchen aspirin EC 81 MG tablet Take 1 tablet (81 mg total) by mouth daily. Swallow whole.  . clindamycin (CLEOCIN) 150 MG capsule TAKE 4 CAPSULES 1 HOUREPRIOR TO DENTAL APPOINTMENT  . doxycycline (VIBRAMYCIN) 50 MG capsule Take 50 mg by mouth 2 (two) times daily.  . metoprolol tartrate (LOPRESSOR) 100 MG tablet Take 1 tablet (100 mg total) by mouth 2 (two) times daily.  Marland Kitchen omeprazole (PRILOSEC) 40 MG capsule Take 40 mg by mouth every morning.     Allergies:   Diovan [valsartan], Amoxicillin, Augmentin [amoxicillin-pot clavulanate], Hctz [  hydrochlorothiazide], Zithromax [azithromycin], Albuterol, Cephalexin, Diphenhydramine hcl, Fexofenadine, Penicillins, Toprol xl [metoprolol tartrate], Sulfa antibiotics, Sulfacetamide sodium, and Sulfasalazine   Social History   Socioeconomic History  . Marital status: Married    Spouse name: Not on file  . Number of children: Not on file  . Years of education: Not on file  . Highest education level: Not on file  Occupational History  . Not on file  Tobacco Use  . Smoking status: Never Smoker  . Smokeless tobacco: Never Used  Vaping Use  . Vaping Use: Never used  Substance and Sexual Activity  . Alcohol use: No  . Drug use: No  . Sexual activity: Not on file  Other Topics Concern  . Not on file  Social History Narrative   . Not on file   Social Determinants of Health   Financial Resource Strain: Not on file  Food Insecurity: Not on file  Transportation Needs: Not on file  Physical Activity: Not on file  Stress: Not on file  Social Connections: Not on file     Family History: The patient's family history includes Breast cancer in her mother; Coronary artery disease in her father; Heart disease in her father; Hypertension in her father. ROS:   Please see the history of present illness.     All other systems reviewed and are negative.  EKGs/Labs/Other Studies Reviewed:    The following studies were reviewed today:   EKG:     Recent Labs: 10/22/2020: ALT 25; BUN 13; Creatinine, Ser 0.64; Hemoglobin 14.0; Platelets 255; Potassium 4.5; Sodium 143; TSH 1.250  Recent Lipid Panel    Component Value Date/Time   CHOL 184 10/22/2020 1235   TRIG 139 10/22/2020 1235   HDL 41 10/22/2020 1235   CHOLHDL 4.5 (H) 10/22/2020 1235   CHOLHDL 4 11/27/2014 1204   VLDL 34.6 11/27/2014 1204   LDLCALC 118 (H) 10/22/2020 1235   LDLDIRECT 158.3 03/09/2011 0837    Physical Exam:    Physical Exam: Blood pressure 128/82, pulse 74, height 5' 7.5" (1.715 m), weight 234 lb 6.4 oz (106.3 kg), SpO2 95 %.  GEN:  Well nourished, well developed in no acute distress HEENT: Normal NECK: No JVD; No carotid bruits LYMPHATICS: No lymphadenopathy CARDIAC: RRR , no murmurs, rubs, gallops RESPIRATORY:  Clear to auscultation without rales, wheezing or rhonchi  ABDOMEN: Soft, non-tender, non-distended MUSCULOSKELETAL:  No edema; No deformity  SKIN: Warm and dry NEUROLOGIC:  Alert and oriented x 3   ASSESSMENT:    No diagnosis found. PLAN:      HTN:    BP is better after the addition of amlodipine and the higher dose of metoprolol . Cont current meds.   2.   Chronic diastolic dysfnction:   Advised better BP control.   Weight loss.   3.  Small pericardial effusion:   Benign.   4.  Amaurosis fugax:  Duplex was  normal.    Medication Adjustments/Labs and Tests Ordered: Current medicines are reviewed at length with the patient today.  Concerns regarding medicines are outlined above.  No orders of the defined types were placed in this encounter.  No orders of the defined types were placed in this encounter.   Signed, Kristeen Miss, MD  10/26/2020 2:08 PM    [facility]

## 2020-10-26 ENCOUNTER — Telehealth: Payer: Self-pay | Admitting: Cardiovascular Disease

## 2020-10-26 ENCOUNTER — Ambulatory Visit: Payer: Medicare PPO | Admitting: Cardiovascular Disease

## 2020-10-26 ENCOUNTER — Encounter: Payer: Self-pay | Admitting: Cardiovascular Disease

## 2020-10-26 ENCOUNTER — Other Ambulatory Visit: Payer: Self-pay

## 2020-10-26 ENCOUNTER — Encounter: Payer: Self-pay | Admitting: Nurse Practitioner

## 2020-10-26 VITALS — BP 128/82 | HR 74 | Ht 67.5 in | Wt 234.4 lb

## 2020-10-26 DIAGNOSIS — I10 Essential (primary) hypertension: Secondary | ICD-10-CM

## 2020-10-26 NOTE — Telephone Encounter (Signed)
Follow Up:     Pt is returning Carol's call from today, concerning her Echo results.

## 2020-10-26 NOTE — Patient Instructions (Signed)
Medication Instructions:  Your physician recommends that you continue on your current medications as directed. Please refer to the Current Medication list given to you today.  *If you need a refill on your cardiac medications before your next appointment, please call your pharmacy*   Lab Work: None today If you have labs (blood work) drawn today and your tests are completely normal, you will receive your results only by: Marland Kitchen MyChart Message (if you have MyChart) OR . A paper copy in the mail If you have any lab test that is abnormal or we need to change your treatment, we will call you to review the results.   Testing/Procedures: None ordered    Follow-Up: At [facility], you and your health needs are our priority.  As part of our continuing mission to provide you with exceptional heart care, we have created designated Provider Care Teams.  These Care Teams include your primary Cardiologist (physician) and Advanced Practice Providers (APPs -  Physician Assistants and Nurse Practitioners) who all work together to provide you with the care you need, when you need it.  Your next appointment:   3-6 month(s)  The format for your next appointment:   In Person  Provider:   Nona Dell, MD

## 2020-10-26 NOTE — Telephone Encounter (Signed)
Pt called back and has been notified of all test results by phone with verbal understanding. Pt would like to have a copy of her test reports when she comes in to see Dr. Elease Hashimoto today. Pt thanked me for the call and taking the time to go over all of her test results and explaining them to her. The patient has been notified of the result and verbalized understanding.  All questions (if any) were answered. Danielle Rankin, [facility] 10/26/2020 8:59 AM

## 2020-11-03 ENCOUNTER — Telehealth: Payer: Self-pay | Admitting: Nurse Practitioner

## 2020-11-03 MED ORDER — METOPROLOL TARTRATE 50 MG PO TABS: 75.0000 mg | ORAL_TABLET | Freq: Two times a day (BID) | ORAL | 2 refills | Status: DC

## 2020-11-03 NOTE — Telephone Encounter (Signed)
Pt c/o medication issue:  1. Name of Medication: amLODipine (NORVASC) 5 MG tablet; metoprolol tartrate (LOPRESSOR) 100 MG tablet  2. How are you currently taking this medication (dosage and times per day)? As written  3. Are you having a reaction (difficulty breathing--STAT)? No  4. What is your medication issue? Very much fatigued, lightheadedness, cold hands and feet

## 2020-11-03 NOTE — Telephone Encounter (Signed)
Do we know what her heart rate is doing?  Ok to cut the Metoprolol back to 75 mg BID  See back as planned. Routing to Dr. Elease Hashimoto as Lyndle Herrlich

## 2020-11-03 NOTE — Telephone Encounter (Signed)
Pulse is running 60-70s (forgot to add that in previous note). Pt advised to decrease Lopressor to 75 and see if makes improvement.  She will take 1 1/2 tablets, of her 50 mg tablets, BID. Pt advised to call office next week if no improvement. Patient verbalized understanding and agreeable to plan.

## 2020-11-03 NOTE — Telephone Encounter (Signed)
Reports fatigue, light headedness and cold hands/feet since 1/27. She also reports leg/muscle pain has increased as well.  States that her Metoprolol was increased to 100 mg BID and was started on Norvasc. She currently has laid around all day d/t fatigue. BP normal:  Today 120/76, 129/74 & yesterday 117/78, 122/71 Aware will forward to Dr. Elease Hashimoto & Norma Fredrickson, NP for advisement being that she saw both in last several weeks. Aware office will call back once reviewed/advised on. Patient verbalized understanding and agreeable to plan.

## 2020-12-03 ENCOUNTER — Other Ambulatory Visit: Payer: Self-pay | Admitting: Cardiovascular Disease

## 2020-12-03 MED ORDER — METOPROLOL TARTRATE 50 MG PO TABS: 75.0000 mg | ORAL_TABLET | Freq: Two times a day (BID) | ORAL | 3 refills | Status: DC

## 2021-01-21 ENCOUNTER — Telehealth: Payer: Self-pay | Admitting: Cardiovascular Disease

## 2021-01-21 NOTE — Telephone Encounter (Signed)
Pt reports: She started taking the double Norvasc & Lopressor as instructed beginning of February and "my hands were like ice" and started to "turn puffy". She called office week later and they reduced it to Lopressor to 75 mg bid & Norvasc back to 5 mg qd. Issues remained and she "finally went back to my original 50" BID. Hands feeling like ice resolved once she went back to this dose. Before she reduced it, she was walking across the floor, and "my body just gave out on me and I fell flat on the wood floor.  And not I am walking with a cane for my balance". My knees were burning, hurting all over, my muscles I couldn't control She has been taking Lopressor 50 mg BID and Norvasc 5 mg QD for the past several months.".    Yesterday she reports: " I couldn't speak for a few seconds, I could not get out the words, very scary". She asks if she had a stroke, could that have caused that?"   Currently feeling ok. BPs normal. No light headed/dizziness.  But her biggest concern is that she "wasn't like this before I came out there". By lunch she is so fatigued she "could go to sleep and sleep for 3 hours".  She mentions she has always been fatigued but this doesn't seem normal. BPs recently: 118/73, HR 75 125/71, HR 82 120/74, HR 77 116/64, HR 56  Pt advised that being she has been on the lower doses of both medication for several months w/o issues leads me to believe that reported symptoms yesterday were r/t something else and not medication related. Advised pt to follow up w/ PCP first thing in the morning to rule out possible stroke.   Aware I will forward to Dr. Elease Hashimoto nurse to address w/ Nahser tomorrow.  Aware she will follow up w/ pt tomorrow and let pt know what Dr. Elease Hashimoto advises.  She reports that she may be able to get her dtr to drive her out to [facility] tomorrow if he feels necessary.  (pt has not established in [place] office yet, recall in for July. Requested transfer to [facility] as  they do not drive to [place] anymore)

## 2021-01-21 NOTE — Telephone Encounter (Signed)
The change is fine with me but if this has been asked in the past, I think Dr. Diona Browner commented that they had very limited clinic time available to needing to see Dr. Abelardo Diesel patients.

## 2021-01-21 NOTE — Telephone Encounter (Signed)
New Message:    Pt said this had already been agreed on once, I did not see a telephone encounter stating this. Pt would like to change to Dr Cassandria Anger, much closer for her. She said it is hard to get to [place]. Do you both agree on this?

## 2021-01-21 NOTE — Telephone Encounter (Signed)
New Message:     Pt says she needs to be seen asap please. She is fatigued, walking with cane all the time, balance is off,dizzy She wonder if t might be the medicine.  Pt c/o medication issue:  1. Name of Medication: Metoprolol and Amlodipine   2. How are you currently taking this medication (dosage and times per day)? Metoprolol 2 times a day and Amlodipine 1 time a day  3. Are you having a reaction (difficulty breathing--STAT)? no  4. What is your medication issue? Dizziness, fatigues , off balance, she just does not feel right

## 2021-01-22 ENCOUNTER — Other Ambulatory Visit: Payer: Self-pay | Admitting: Family Medicine

## 2021-01-22 ENCOUNTER — Other Ambulatory Visit (HOSPITAL_COMMUNITY): Payer: Self-pay | Admitting: Family Medicine

## 2021-01-22 DIAGNOSIS — R4701 Aphasia: Secondary | ICD-10-CM

## 2021-01-22 NOTE — Telephone Encounter (Signed)
Yes, we can get her on the schedule for a routine visit.

## 2021-01-22 NOTE — Telephone Encounter (Signed)
Her BP and HR are normal. Agree with having her see her primary md to check on possible stroke / TIA Cont current meds.

## 2021-01-22 NOTE — Telephone Encounter (Signed)
Attempted phone call to pt.  Per Epic ok to leave detailed message.  Advised Dr Elease Hashimoto has reviewed BP and HR's which are normal.  Dr Elease Hashimoto agrees with RN's recommendation from yesterday for pt to f/u with PCP for further evaluation of possible TIA or stroke.  Please call [phone] for any further questions.

## 2021-01-26 ENCOUNTER — Ambulatory Visit (HOSPITAL_COMMUNITY): Payer: Medicare PPO

## 2021-02-05 ENCOUNTER — Ambulatory Visit (HOSPITAL_COMMUNITY): Payer: Medicare PPO

## 2021-02-08 ENCOUNTER — Other Ambulatory Visit (HOSPITAL_COMMUNITY): Payer: Self-pay | Admitting: Family Medicine

## 2021-02-08 ENCOUNTER — Other Ambulatory Visit: Payer: Self-pay

## 2021-02-08 ENCOUNTER — Ambulatory Visit (HOSPITAL_COMMUNITY)
Admission: RE | Admit: 2021-02-08 | Discharge: 2021-02-08 | Disposition: A | Discharge status: Home / Self Care | Payer: Medicare PPO | Source: Ambulatory Visit | Attending: Family Medicine | Admitting: Family Medicine

## 2021-02-08 DIAGNOSIS — M25561 Pain in right knee: Secondary | ICD-10-CM | POA: Insufficient documentation

## 2021-02-08 DIAGNOSIS — M545 Low back pain, unspecified: Secondary | ICD-10-CM | POA: Insufficient documentation

## 2021-02-19 ENCOUNTER — Other Ambulatory Visit (HOSPITAL_COMMUNITY): Payer: Medicare PPO

## 2021-03-03 ENCOUNTER — Ambulatory Visit (HOSPITAL_COMMUNITY)
Admission: RE | Admit: 2021-03-03 | Discharge: 2021-03-03 | Disposition: A | Discharge status: Home / Self Care | Payer: Medicare PPO | Source: Ambulatory Visit | Attending: Family Medicine | Admitting: Family Medicine

## 2021-03-03 ENCOUNTER — Other Ambulatory Visit: Payer: Self-pay

## 2021-03-03 DIAGNOSIS — R4701 Aphasia: Secondary | ICD-10-CM | POA: Insufficient documentation

## 2021-05-11 ENCOUNTER — Encounter: Payer: Self-pay | Admitting: Cardiology

## 2021-05-11 ENCOUNTER — Ambulatory Visit: Payer: Medicare PPO | Admitting: Cardiology

## 2021-05-11 ENCOUNTER — Other Ambulatory Visit: Payer: Self-pay

## 2021-05-11 VITALS — BP 140/80 | HR 72 | Ht 67.5 in | Wt 229.0 lb

## 2021-05-11 DIAGNOSIS — I6523 Occlusion and stenosis of bilateral carotid arteries: Secondary | ICD-10-CM

## 2021-05-11 DIAGNOSIS — I10 Essential (primary) hypertension: Secondary | ICD-10-CM | POA: Diagnosis not present

## 2021-05-11 DIAGNOSIS — I493 Ventricular premature depolarization: Secondary | ICD-10-CM | POA: Diagnosis not present

## 2021-05-11 NOTE — Patient Instructions (Signed)
Medication Instructions:  Your physician recommends that you continue on your current medications as directed. Please refer to the Current Medication list given to you today.  *If you need a refill on your cardiac medications before your next appointment, please call your pharmacy*   Lab Work: None  If you have labs (blood work) drawn today and your tests are completely normal, you will receive your results only by: MyChart Message (if you have MyChart) OR A paper copy in the mail If you have any lab test that is abnormal or we need to change your treatment, we will call you to review the results.   Testing/Procedures: None today    Follow-Up: At [facility], you and your health needs are our priority.  As part of our continuing mission to provide you with exceptional heart care, we have created designated Provider Care Teams.  These Care Teams include your primary Cardiologist (physician) and Advanced Practice Providers (APPs -  Physician Assistants and Nurse Practitioners) who all work together to provide you with the care you need, when you need it.  We recommend signing up for the patient portal called "MyChart".  Sign up information is provided on this After Visit Summary.  MyChart is used to connect with patients for Virtual Visits (Telemedicine).  Patients are able to view lab/test results, encounter notes, upcoming appointments, etc.  Non-urgent messages can be sent to your provider as well.   To learn more about what you can do with MyChart, go to [email or web address].    Your next appointment:   6 month(s)  The format for your next appointment:   In Person  Provider:   Nona Dell, MD   Other Instructions None

## 2021-05-11 NOTE — Progress Notes (Signed)
Cardiology Office Note  Date: 05/11/2021   ID: [name], DOB January 05, 1951, MRN [id number]  PCP:  Barbie Banner, MD  Cardiologist:  Nona Dell, MD Electrophysiologist:  None   Chief Complaint  Patient presents with   Cardiac follow-up     History of Present Illness: [name] is a 70 y.o. female former patient of Dr. Elease Hashimoto now presenting to establish follow-up with me.  I reviewed her records and updated the chart.  She was last seen in January.  She is here today with her husband who is also a patient of mine.  She reports relative lack of stamina, also what sounds like symptomatic bradycardia when she was on higher dose Lopressor.  Since cutting the dose back to 50 mg twice daily she has felt better.  I encouraged her to try to get back to her regular walking plan.  She does not report any angina symptoms.  Echocardiogram done in January revealed LVEF 50 to 55% with mild LVH and mild diastolic dysfunction, normal RV contraction, mildly dilated left atrium, trivial pericardial effusion, and mildly sclerotic aortic valve without stenosis.  She is on Plavix per Dr. Gerilyn Pilgrim with history of amaurosis fugax and mild carotid atherosclerosis.  She does not report any palpitations or sudden syncope.  Past Medical History:  Diagnosis Date   Bronchitis    Essential hypertension    Exogenous obesity    Gastroesophageal reflux disease without esophagitis    Hearing loss    Hypercholesteremia    PVC's (premature ventricular contractions)     Past Surgical History:  Procedure Laterality Date   BREAST EXCISIONAL BIOPSY Right 40 years ago   benign   COLON SURGERY  07/08/11   emergency colon surgery    hysterectomy - unknown type     lap resection of perforated diverticulum  07/08/2011   drainage of intraabdominal abscess    Current Outpatient Medications  Medication Sig Dispense Refill   Acetaminophen (TYLENOL PO) Take 250 mg by mouth 2 (two) times daily as needed  (pain).      amLODipine (NORVASC) 5 MG tablet Take 1 tablet (5 mg total) by mouth daily. 30 tablet 11   clindamycin (CLEOCIN) 150 MG capsule TAKE 4 CAPSULES 1 HOUREPRIOR TO DENTAL APPOINTMENT     clopidogrel (PLAVIX) 75 MG tablet clopidogrel 75 mg tablet  TAKE ONE TABLET ONCE DAILY (STOP DAILY ASPIRIN)     doxycycline (VIBRAMYCIN) 50 MG capsule Take 50 mg by mouth daily as needed.     metoprolol tartrate (LOPRESSOR) 50 MG tablet Take 50 mg by mouth 2 (two) times daily.     omeprazole (PRILOSEC) 40 MG capsule Take 40 mg by mouth every morning.     aspirin EC 81 MG tablet Take 1 tablet (81 mg total) by mouth daily. Swallow whole. (Patient not taking: Reported on 05/11/2021) 90 tablet 3   No current facility-administered medications for this visit.   Allergies:  Diovan [valsartan], Amoxicillin, Augmentin [amoxicillin-pot clavulanate], Hctz [hydrochlorothiazide], Zithromax [azithromycin], Albuterol, Cephalexin, Diphenhydramine hcl, Fexofenadine, Penicillins, Toprol xl [metoprolol tartrate], Sulfa antibiotics, Sulfacetamide sodium, and Sulfasalazine   ROS: No orthopnea or PND.  Chronic arthritic symptoms.  Physical Exam: VS:  BP 140/80   Pulse 72   Ht 5' 7.5" (1.715 m)   Wt 229 lb (103.9 kg)   SpO2 100%   BMI 35.34 kg/m , BMI Body mass index is 35.34 kg/m.  Wt Readings from Last 3 Encounters:  05/11/21 229 lb (103.9 kg)  10/26/20 234 lb 6.4 oz (106.3 kg)  10/22/20 239 lb 3.2 oz (108.5 kg)    General: Patient appears comfortable at rest. HEENT: Conjunctiva and lids normal, wearing a mask. Neck: Supple, no elevated JVP or carotid bruits, no thyromegaly. Lungs: Clear to auscultation, nonlabored breathing at rest. Cardiac: Regular rate and rhythm, no S3, 1/6 systolic murmur, no pericardial rub. Abdomen: Soft, nontender, bowel sounds present. Extremities: No pitting edema, distal pulses 2+.  ECG:  An ECG dated 10/22/2020 was personally reviewed today and demonstrated:  Normal sinus  rhythm.  Recent Labwork: 10/22/2020: ALT 25; AST 17; BUN 13; Creatinine, Ser 0.64; Hemoglobin 14.0; Platelets 255; Potassium 4.5; Sodium 143; TSH 1.250     Component Value Date/Time   CHOL 184 10/22/2020 1235   TRIG 139 10/22/2020 1235   HDL 41 10/22/2020 1235   CHOLHDL 4.5 (H) 10/22/2020 1235   CHOLHDL 4 11/27/2014 1204   VLDL 34.6 11/27/2014 1204   LDLCALC 118 (H) 10/22/2020 1235   LDLDIRECT 158.3 03/09/2011 0837    Other Studies Reviewed Today:  Echocardiogram 10/23/2020:  1. Left ventricular ejection fraction, by estimation, is 50 to 55%. The  left ventricle has low normal function. The left ventricle has no regional  wall motion abnormalities. There is mild left ventricular hypertrophy.  Left ventricular diastolic  parameters are consistent with Grade I diastolic dysfunction (impaired  relaxation).   2. Right ventricular systolic function is normal. The right ventricular  size is normal.   3. Left atrial size was mildly dilated.   4. The pericardial effusion is circumferential.   5. The mitral valve is normal in structure. Trivial mitral valve  regurgitation. No evidence of mitral stenosis.   6. The aortic valve is tricuspid. There is mild calcification of the  aortic valve. There is mild thickening of the aortic valve. Aortic valve  regurgitation is mild. No aortic stenosis is present.   7. The inferior vena cava is normal in size with greater than 50%  respiratory variability, suggesting right atrial pressure of 3 mmHg.   Carotid Dopplers 10/23/2020: IMPRESSION: Mild bilateral carotid atherosclerosis. No hemodynamically significant ICA stenosis. Degree of narrowing less than 50% bilaterally by ultrasound criteria.   Patent antegrade vertebral flow bilaterally  Assessment and Plan:  1.  History of PVCs, no active sense of palpitations.  LVEF 50 to 55% by echocardiogram in January.  Continue Lopressor and observation.  2.  Essential hypertension, currently on  Norvasc in addition to Lopressor.  Systolic 140 today.  No changes were made at this time.  Keep follow-up with PCP and resume prior walking regimen in gradual fashion.  3.  Mild carotid atherosclerosis with history of amaurosis fugax.  She is on Plavix on the direction of Dr. Gerilyn Pilgrim.  Recommend lipid panel with PCP and consideration of statin therapy to further reduce risk.  Medication Adjustments/Labs and Tests Ordered: Current medicines are reviewed at length with the patient today.  Concerns regarding medicines are outlined above.   Tests Ordered: No orders of the defined types were placed in this encounter.   Medication Changes: No orders of the defined types were placed in this encounter.   Disposition:  Follow up  6 months.  Signed, Jonelle Sidle, MD, [facility] 05/11/2021 1:51 PM    [facility] [address] [place], [place] [address] Phone: [phone]; Fax: [phone]

## 2021-10-15 ENCOUNTER — Other Ambulatory Visit: Payer: Self-pay

## 2021-10-15 MED ORDER — AMLODIPINE BESYLATE 5 MG PO TABS: 5.0000 mg | ORAL_TABLET | Freq: Every day | ORAL | 3 refills | Status: DC

## 2021-10-15 NOTE — Telephone Encounter (Signed)
This is Dr. Ival Bible pt now. Please address

## 2022-01-06 ENCOUNTER — Other Ambulatory Visit: Payer: Self-pay | Admitting: Cardiovascular Disease

## 2022-01-06 NOTE — Telephone Encounter (Signed)
This is a [place] pt, Dr. Diona Browner pt ?

## 2022-02-14 ENCOUNTER — Other Ambulatory Visit: Payer: Self-pay | Admitting: Cardiology

## 2022-02-24 ENCOUNTER — Other Ambulatory Visit: Payer: Self-pay | Admitting: Cardiology

## 2022-06-27 ENCOUNTER — Other Ambulatory Visit: Payer: Self-pay | Admitting: Cardiology

## 2022-08-10 ENCOUNTER — Other Ambulatory Visit: Payer: Self-pay | Admitting: Cardiology

## 2022-08-23 NOTE — Progress Notes (Unsigned)
Cardiology Office Note  Date: 08/24/2022   ID: [name], DOB 05-08-1951, MRN [id number]  PCP:  Barbie Banner, MD  Cardiologist:  Nona Dell, MD Electrophysiologist:  None   Chief Complaint  Patient presents with   Cardiac follow-up    History of Present Illness: [name] is a 71 y.o. female last seen in August 2022.  She is here with her husband for a follow-up visit.  Reports no chest pain or palpitations.  She is in the process of changing primary care, establishing with [facility] around the first of the year.  Her blood pressure is elevated today.  She states that she does check it at home, systolics are often over 140.  She reports compliance with her medications.  Has not tolerated more than 50 mg Lopressor twice daily due to fatigue, also on Norvasc 5 mg daily.  She has other medication intolerances as outlined below.  I personally reviewed her ECG today which shows normal sinus rhythm.  She has not had any recent lab work for review.  Past Medical History:  Diagnosis Date   Bronchitis    Essential hypertension    Exogenous obesity    Gastroesophageal reflux disease without esophagitis    Hearing loss    Hypercholesteremia    PVC's (premature ventricular contractions)     Past Surgical History:  Procedure Laterality Date   BREAST EXCISIONAL BIOPSY Right 40 years ago   benign   COLON SURGERY  07/08/11   emergency colon surgery    hysterectomy - unknown type     lap resection of perforated diverticulum  07/08/2011   drainage of intraabdominal abscess    Current Outpatient Medications  Medication Sig Dispense Refill   Acetaminophen (TYLENOL PO) Take 250 mg by mouth 2 (two) times daily as needed (pain).      amLODipine (NORVASC) 5 MG tablet TAKE ONE TABLET ONCE DAILY 90 tablet 1   clopidogrel (PLAVIX) 75 MG tablet clopidogrel 75 mg tablet  TAKE ONE TABLET ONCE DAILY (STOP DAILY ASPIRIN)     doxycycline (VIBRAMYCIN) 50 MG capsule Take 50 mg by mouth  daily as needed.     metoprolol tartrate (LOPRESSOR) 50 MG tablet TAKE ONE AND ONE-HALF TABLETS BY MOUTH TWICE DAILY 90 tablet 2   omeprazole (PRILOSEC) 40 MG capsule Take 40 mg by mouth every morning.     aspirin EC 81 MG tablet Take 1 tablet (81 mg total) by mouth daily. Swallow whole. (Patient not taking: Reported on 05/11/2021) 90 tablet 3   clindamycin (CLEOCIN) 150 MG capsule TAKE 4 CAPSULES 1 HOUREPRIOR TO DENTAL APPOINTMENT (Patient not taking: Reported on 08/24/2022)     No current facility-administered medications for this visit.   Allergies:  Diovan [valsartan], Amoxicillin, Augmentin [amoxicillin-pot clavulanate], Hctz [hydrochlorothiazide], Zithromax [azithromycin], Albuterol, Cephalexin, Diphenhydramine hcl, Fexofenadine, Penicillins, Toprol xl [metoprolol tartrate], Sulfa antibiotics, Sulfacetamide sodium, and Sulfasalazine   ROS: No orthopnea or PND.  Physical Exam: VS:  BP (!) 180/78   Pulse 73   Ht 5' 7.5" (1.715 m)   Wt 237 lb (107.5 kg)   SpO2 94%   BMI 36.57 kg/m , BMI Body mass index is 36.57 kg/m.  Wt Readings from Last 3 Encounters:  08/24/22 237 lb (107.5 kg)  05/11/21 229 lb (103.9 kg)  10/26/20 234 lb 6.4 oz (106.3 kg)    General: Patient appears comfortable at rest. HEENT: Conjunctiva and lids normal. Neck: Supple, no elevated JVP or carotid bruits. Lungs: Clear to auscultation, nonlabored  breathing at rest. Cardiac: Regular rate and rhythm, no S3, 1/6 systolic murmur. Extremities: No pitting edema.  ECG:  An ECG dated 10/22/2020 was personally reviewed today and demonstrated:  Sinus rhythm.  Recent Labwork:    Component Value Date/Time   CHOL 184 10/22/2020 1235   TRIG 139 10/22/2020 1235   HDL 41 10/22/2020 1235   CHOLHDL 4.5 (H) 10/22/2020 1235   CHOLHDL 4 11/27/2014 1204   VLDL 34.6 11/27/2014 1204   LDLCALC 118 (H) 10/22/2020 1235   LDLDIRECT 158.3 03/09/2011 0837  January 2022: TSH 1.25, hemoglobin 14.0, platelets 255, BUN 13, creatinine  0.64, potassium 4.5  Other Studies Reviewed Today:  Echocardiogram 10/23/2020:  1. Left ventricular ejection fraction, by estimation, is 50 to 55%. The  left ventricle has low normal function. The left ventricle has no regional  wall motion abnormalities. There is mild left ventricular hypertrophy.  Left ventricular diastolic  parameters are consistent with Grade I diastolic dysfunction (impaired  relaxation).   2. Right ventricular systolic function is normal. The right ventricular  size is normal.   3. Left atrial size was mildly dilated.   4. The pericardial effusion is circumferential.   5. The mitral valve is normal in structure. Trivial mitral valve  regurgitation. No evidence of mitral stenosis.   6. The aortic valve is tricuspid. There is mild calcification of the  aortic valve. There is mild thickening of the aortic valve. Aortic valve  regurgitation is mild. No aortic stenosis is present.   7. The inferior vena cava is normal in size with greater than 50%  respiratory variability, suggesting right atrial pressure of 3 mmHg.    Carotid Dopplers 10/23/2020: IMPRESSION: Mild bilateral carotid atherosclerosis. No hemodynamically significant ICA stenosis. Degree of narrowing less than 50% bilaterally by ultrasound criteria.   Patent antegrade vertebral flow bilaterally  Assessment and Plan:  1.  Essential hypertension, blood pressure not optimally controlled.  She is on Lopressor and Norvasc which we will continue at current dosing.  Check BMET and most likely try addition of low-dose Aldactone 12.5 mg daily next.  She will establish with new PCP in January as well.  2.  History of PVCs, no palpitations or syncope.  She continues on Lopressor.  LVEF 50 to 55% range as of 2022.  Continue observation for now.  3.  Mild carotid atherosclerosis with prior history of amaurosis fugax.  She has followed with Dr. Merlene Laughter but is planning to establish with new neurologist.  Continue  Plavix.  Medication Adjustments/Labs and Tests Ordered: Current medicines are reviewed at length with the patient today.  Concerns regarding medicines are outlined above.   Tests Ordered: Orders Placed This Encounter  Procedures   Basic metabolic panel   EKG [id number]    Medication Changes: No orders of the defined types were placed in this encounter.   Disposition:  Follow up  6 months.  Signed, Satira Sark, MD, [facility] 08/24/2022 10:39 AM    [facility] [place]. [address] [place], [place] [address] Phone: [phone]; Fax: [phone]

## 2022-08-24 ENCOUNTER — Ambulatory Visit: Payer: Medicare PPO | Attending: Cardiology | Admitting: Cardiology

## 2022-08-24 ENCOUNTER — Other Ambulatory Visit (HOSPITAL_COMMUNITY)
Admission: RE | Admit: 2022-08-24 | Discharge: 2022-08-24 | Disposition: A | Discharge status: Home / Self Care | Payer: Medicare PPO | Source: Ambulatory Visit | Attending: Cardiology | Admitting: Cardiology

## 2022-08-24 ENCOUNTER — Telehealth: Payer: Self-pay

## 2022-08-24 ENCOUNTER — Encounter: Payer: Self-pay | Admitting: Cardiology

## 2022-08-24 VITALS — BP 180/78 | HR 73 | Ht 67.5 in | Wt 237.0 lb

## 2022-08-24 DIAGNOSIS — Z79899 Other long term (current) drug therapy: Secondary | ICD-10-CM

## 2022-08-24 DIAGNOSIS — I10 Essential (primary) hypertension: Secondary | ICD-10-CM | POA: Insufficient documentation

## 2022-08-24 DIAGNOSIS — I493 Ventricular premature depolarization: Secondary | ICD-10-CM | POA: Diagnosis not present

## 2022-08-24 DIAGNOSIS — I6523 Occlusion and stenosis of bilateral carotid arteries: Secondary | ICD-10-CM | POA: Diagnosis not present

## 2022-08-24 LAB — BASIC METABOLIC PANEL
Anion gap: 10 (ref 5–15)
BUN: 15 mg/dL (ref 8–23)
CO2: 24 mmol/L (ref 22–32)
Calcium: 8.9 mg/dL (ref 8.9–10.3)
Chloride: 106 mmol/L (ref 98–111)
Creatinine, Ser: 0.64 mg/dL (ref 0.44–1.00)
GFR, Estimated: >60 mL/min (ref >60)
Glucose, Bld: 106 mg/dL — ABNORMAL HIGH (ref 70–99)
Potassium: 4.2 mmol/L (ref 3.5–5.1)
Sodium: 140 mmol/L (ref 135–145)

## 2022-08-24 MED ORDER — SPIRONOLACTONE 25 MG PO TABS: 12.5000 mg | ORAL_TABLET | Freq: Every day | ORAL | 3 refills | Status: DC

## 2022-08-24 NOTE — Telephone Encounter (Signed)
Lab results discussed with patient and she agrees to start aldactone 12.5 mg qd and repeat bmet in 1 month. I will mail lab slip to [facility] to her. I will copy pcp lab results

## 2022-08-24 NOTE — Telephone Encounter (Signed)
-----   Message from Jonelle Sidle, MD sent at 08/24/2022  1:05 PM EST ----- Results reviewed.  Renal function and potassium are normal.  As reviewed in the recent office note, let's try Aldactone 12.5 mg daily as additional antihypertensive therapy.  If tolerated we would need to get a follow-up BMET in 1 month.  Also, her glucose was mildly elevated at 106.  This is something that can be reviewed further when she meets her new PCP in January.

## 2022-08-24 NOTE — Patient Instructions (Signed)
Medication Instructions:  Your physician recommends that you continue on your current medications as directed. Please refer to the Current Medication list given to you today.   Labwork: BMET today  Testing/Procedures: None today  Follow-Up: 6 months  Any Other Special Instructions Will Be Listed Below (If Applicable).  If you need a refill on your cardiac medications before your next appointment, please call your pharmacy.  

## 2022-08-26 ENCOUNTER — Telehealth: Payer: Self-pay | Admitting: Cardiology

## 2022-08-26 NOTE — Telephone Encounter (Signed)
Patient stated that she researched Spironolactone and stated that what she read online stated that people w/ a family hx of breast cancer should not take medication. Pt stated that she has a family hx of this. Pt also stated that it made her feel horrible and does not want to take medication. Pt stated that she checked bp this morning and it was 134/77.  Please advise.

## 2022-08-26 NOTE — Telephone Encounter (Signed)
Patient is requesting to speak with Dr. Gracy Racer and nurse. Please call back

## 2022-08-29 ENCOUNTER — Encounter: Payer: Self-pay | Admitting: *Deleted

## 2022-08-29 NOTE — Telephone Encounter (Signed)
Patient notified and verbalized understanding. 

## 2022-10-04 ENCOUNTER — Ambulatory Visit: Payer: BC Managed Care – PPO | Admitting: Nurse Practitioner

## 2022-10-12 ENCOUNTER — Ambulatory Visit: Payer: Medicare PPO | Admitting: Nurse Practitioner

## 2022-10-13 ENCOUNTER — Encounter: Payer: Self-pay | Admitting: Nurse Practitioner

## 2022-10-13 ENCOUNTER — Ambulatory Visit: Payer: Medicare PPO | Admitting: Nurse Practitioner

## 2022-10-13 VITALS — BP 165/97 | HR 65 | Temp 97.3°F | Resp 20 | Ht 67.0 in | Wt 235.0 lb

## 2022-10-13 DIAGNOSIS — K219 Gastro-esophageal reflux disease without esophagitis: Secondary | ICD-10-CM | POA: Diagnosis not present

## 2022-10-13 DIAGNOSIS — I10 Essential (primary) hypertension: Secondary | ICD-10-CM

## 2022-10-13 DIAGNOSIS — E7800 Pure hypercholesterolemia, unspecified: Secondary | ICD-10-CM | POA: Diagnosis not present

## 2022-10-13 DIAGNOSIS — I493 Ventricular premature depolarization: Secondary | ICD-10-CM

## 2022-10-13 DIAGNOSIS — L719 Rosacea, unspecified: Secondary | ICD-10-CM

## 2022-10-13 DIAGNOSIS — E6609 Other obesity due to excess calories: Secondary | ICD-10-CM

## 2022-10-13 LAB — CMP14+EGFR
ALT: 16 IU/L (ref 0–32)
AST: 13 IU/L (ref 0–40)
Albumin/Globulin Ratio: 1.7 (ref 1.2–2.2)
Albumin: 4.4 g/dL (ref 3.8–4.8)
Alkaline Phosphatase: 79 IU/L (ref 44–121)
BUN/Creatinine Ratio: 19 (ref 12–28)
BUN: 13 mg/dL (ref 8–27)
Bilirubin Total: 0.9 mg/dL (ref 0.0–1.2)
CO2: 23 mmol/L (ref 20–29)
Calcium: 9.3 mg/dL (ref 8.7–10.3)
Chloride: 104 mmol/L (ref 96–106)
Creatinine, Ser: 0.68 mg/dL (ref 0.57–1.00)
Globulin, Total: 2.6 g/dL (ref 1.5–4.5)
Glucose: 106 mg/dL — ABNORMAL HIGH (ref 70–99)
Potassium: 4.5 mmol/L (ref 3.5–5.2)
Sodium: 142 mmol/L (ref 134–144)
Total Protein: 7 g/dL (ref 6.0–8.5)
eGFR: 93 mL/min/{1.73_m2} (ref >59)

## 2022-10-13 LAB — CBC WITH DIFFERENTIAL/PLATELET
Basophils Absolute: 0.1 10*3/uL (ref 0.0–0.2)
Basos: 1 %
EOS (ABSOLUTE): 0.3 10*3/uL (ref 0.0–0.4)
Eos: 3 %
Hematocrit: 44.3 % (ref 34.0–46.6)
Hemoglobin: 14.3 g/dL (ref 11.1–15.9)
Immature Grans (Abs): 0 10*3/uL (ref 0.0–0.1)
Immature Granulocytes: 0 %
Lymphocytes Absolute: 1.9 10*3/uL (ref 0.7–3.1)
Lymphs: 23 %
MCH: 28 pg (ref 26.6–33.0)
MCHC: 32.3 g/dL (ref 31.5–35.7)
MCV: 87 fL (ref 79–97)
Monocytes Absolute: 0.5 10*3/uL (ref 0.1–0.9)
Monocytes: 6 %
Neutrophils Absolute: 5.6 10*3/uL (ref 1.4–7.0)
Neutrophils: 67 %
Platelets: 274 10*3/uL (ref 150–450)
RBC: 5.1 x10E6/uL (ref 3.77–5.28)
RDW: 12.2 % (ref 11.7–15.4)
WBC: 8.4 10*3/uL (ref 3.4–10.8)

## 2022-10-13 LAB — LIPID PANEL
Chol/HDL Ratio: 4.6 ratio — ABNORMAL HIGH (ref 0.0–4.4)
Cholesterol, Total: 179 mg/dL (ref 100–199)
HDL: 39 mg/dL — ABNORMAL LOW (ref >39)
LDL Chol Calc (NIH): 116 mg/dL — ABNORMAL HIGH (ref 0–99)
Triglycerides: 132 mg/dL (ref 0–149)
VLDL Cholesterol Cal: 24 mg/dL (ref 5–40)

## 2022-10-13 MED ORDER — DOXYCYCLINE HYCLATE 50 MG PO CAPS: 50.0000 mg | ORAL_CAPSULE | Freq: Every day | ORAL | 1 refills | Status: DC | PRN

## 2022-10-13 NOTE — Patient Instructions (Signed)

## 2022-10-13 NOTE — Progress Notes (Signed)
Subjective:    Patient ID: [name], female    DOB: 11/20/1950, 72 y.o.   MRN: [id number]   Chief Complaint: medical management of chronic issues     HPI:  [name] is a 72 y.o. who identifies as a female who was assigned female at birth.   Social history: Lives with: husband Work history: retired   Scientist, forensic in today for follow up of the following chronic medical issues:  1. Primary hypertension No c/o chest pain, sob or headache. Does check blood pressure at home. Runs 476 systolic. Blood pressure is always high at doctors office.  2. PVC's (premature ventricular contractions) No palpitation  3. Gastroesophageal reflux disease without esophagitis Is on omeprazole. She has not needed to take it as of late.  4. Hypercholesteremia Does try to watch diet but does no dedictaed exercise. Lab Results  Component Value Date   CHOL 184 10/22/2020   HDL 41 10/22/2020   LDLCALC 118 (H) 10/22/2020   LDLDIRECT 158.3 03/09/2011   TRIG 139 10/22/2020   CHOLHDL 4.5 (H) 10/22/2020     5. Exogenous obesity No recent weight changes Wt Readings from Last 3 Encounters:  10/13/22 235 lb (106.6 kg)  08/24/22 237 lb (107.5 kg)  05/11/21 229 lb (103.9 kg)   BMI Readings from Last 3 Encounters:  10/13/22 36.81 kg/m  08/24/22 36.57 kg/m  05/11/21 35.34 kg/m      New complaints: None today  Allergies  Allergen Reactions   Diovan [Valsartan] Anaphylaxis    weakness weakness   Amoxicillin Hives and Other (See Comments)    itching itching   Augmentin [Amoxicillin-Pot Clavulanate] Itching and Other (See Comments)    unknown   Hctz [Hydrochlorothiazide] Other (See Comments)    weakness weakness   Zithromax [Azithromycin] Itching and Other (See Comments)    unknown   Albuterol     Other reaction(s): Lethargy (intolerance) Fatigue and depression   Cephalexin Other (See Comments)    Severe acid reflux   Diphenhydramine Hcl Other (See Comments)    Mental status  changes   Fexofenadine Other (See Comments)    unknown   Penicillins Other (See Comments)    unknown unknown   Toprol Xl [Metoprolol Tartrate]     Stinging all over body   Sulfa Antibiotics Itching    itching   Sulfacetamide Sodium Itching    itching   Sulfasalazine Itching    itching   Outpatient Encounter Medications as of 10/13/2022  Medication Sig   Acetaminophen (TYLENOL PO) Take 250 mg by mouth 2 (two) times daily as needed (pain).    amLODipine (NORVASC) 5 MG tablet TAKE ONE TABLET ONCE DAILY   aspirin EC 81 MG tablet Take 1 tablet (81 mg total) by mouth daily. Swallow whole. (Patient not taking: Reported on 05/11/2021)   clindamycin (CLEOCIN) 150 MG capsule TAKE 4 CAPSULES 1 HOUREPRIOR TO DENTAL APPOINTMENT (Patient not taking: Reported on 08/24/2022)   clopidogrel (PLAVIX) 75 MG tablet clopidogrel 75 mg tablet  TAKE ONE TABLET ONCE DAILY (STOP DAILY ASPIRIN)   doxycycline (VIBRAMYCIN) 50 MG capsule Take 50 mg by mouth daily as needed.   metoprolol tartrate (LOPRESSOR) 50 MG tablet TAKE ONE AND ONE-HALF TABLETS BY MOUTH TWICE DAILY   omeprazole (PRILOSEC) 40 MG capsule Take 40 mg by mouth every morning.   spironolactone (ALDACTONE) 25 MG tablet Take 0.5 tablets (12.5 mg total) by mouth daily.   No facility-administered encounter medications on file as of 10/13/2022.  Past Surgical History:  Procedure Laterality Date   BREAST EXCISIONAL BIOPSY Right 40 years ago   benign   COLON SURGERY  07/08/11   emergency colon surgery    hysterectomy - unknown type     lap resection of perforated diverticulum  07/08/2011   drainage of intraabdominal abscess    Family History  Problem Relation Age of Onset   Breast cancer Mother    Hypertension Father    Heart disease Father    Coronary artery disease Father       Controlled substance contract: n/a     Review of Systems  Constitutional:  Negative for diaphoresis.  Eyes:  Negative for pain.  Respiratory:  Negative  for shortness of breath.   Cardiovascular:  Negative for chest pain, palpitations and leg swelling.  Gastrointestinal:  Negative for abdominal pain.  Endocrine: Negative for polydipsia.  Skin:  Negative for rash.  Neurological:  Negative for dizziness, weakness and headaches.  Hematological:  Does not bruise/bleed easily.  All other systems reviewed and are negative.      Objective:   Physical Exam Vitals and nursing note reviewed.  Constitutional:      General: She is not in acute distress.    Appearance: Normal appearance. She is well-developed.  HENT:     Head: Normocephalic.     Right Ear: Tympanic membrane normal.     Left Ear: Tympanic membrane normal.     Nose: Nose normal.     Mouth/Throat:     Mouth: Mucous membranes are moist.  Eyes:     Pupils: Pupils are equal, round, and reactive to light.  Neck:     Vascular: No carotid bruit or JVD.  Cardiovascular:     Rate and Rhythm: Normal rate and regular rhythm.     Heart sounds: Normal heart sounds.  Pulmonary:     Effort: Pulmonary effort is normal. No respiratory distress.     Breath sounds: Normal breath sounds. No wheezing or rales.  Chest:     Chest wall: No tenderness.  Abdominal:     General: Bowel sounds are normal. There is no distension or abdominal bruit.     Palpations: Abdomen is soft. There is no hepatomegaly, splenomegaly, mass or pulsatile mass.     Tenderness: There is no abdominal tenderness.  Musculoskeletal:        General: Normal range of motion.     Cervical back: Normal range of motion and neck supple.  Lymphadenopathy:     Cervical: No cervical adenopathy.  Skin:    General: Skin is warm and dry.  Neurological:     Mental Status: She is alert and oriented to person, place, and time.     Deep Tendon Reflexes: Reflexes are normal and symmetric.  Psychiatric:        Behavior: Behavior normal.        Thought Content: Thought content normal.        Judgment: Judgment normal.    BP (!)  165/97   Pulse 65   Temp (!) 97.3 F (36.3 C) (Temporal)   Resp 20   Ht 5\' 7"  (1.702 m)   Wt 235 lb (106.6 kg)   SpO2 99%   BMI 36.81 kg/m         Assessment & Plan:  [name] comes in today with chief complaint of Establish Care (Tiffany will be PCP/)   Diagnosis and orders addressed:  1. Primary hypertension Low sodium diet - CBC with  Differential/Platelet - CMP14+EGFR - Lipid panel  2. PVC's (premature ventricular contractions) Report any palpitations  3. Gastroesophageal reflux disease without esophagitis Omeprazole as needed  4. Hypercholesteremia Low fat diet  5. Exogenous obesity Discussed diet and exercise for person with BMI >25 Will recheck weight in 3-6 months   6. Rosacea - doxycycline (VIBRAMYCIN) 50 MG capsule; Take 1 capsule (50 mg total) by mouth daily as needed.  Dispense: 30 capsule; Refill: 1   Labs pending Health Maintenance reviewed Diet and exercise encouraged  Follow up plan: 6 months   Mary-Margaret Hassell Done, FNP

## 2022-10-14 MED ORDER — ROSUVASTATIN CALCIUM 10 MG PO TABS: 10.0000 mg | ORAL_TABLET | Freq: Every day | ORAL | 3 refills | Status: DC

## 2022-10-14 NOTE — Addendum Note (Signed)
Addended by: Chevis Pretty on: 10/14/2022 01:07 PM   Modules accepted: Orders

## 2022-11-08 ENCOUNTER — Encounter: Payer: Self-pay | Admitting: Neurology

## 2022-11-08 ENCOUNTER — Ambulatory Visit: Payer: Medicare PPO | Admitting: Neurology

## 2022-11-08 VITALS — BP 149/81 | HR 67 | Ht 67.6 in | Wt 234.0 lb

## 2022-11-08 DIAGNOSIS — R109 Unspecified abdominal pain: Secondary | ICD-10-CM | POA: Insufficient documentation

## 2022-11-08 DIAGNOSIS — G459 Transient cerebral ischemic attack, unspecified: Secondary | ICD-10-CM | POA: Diagnosis not present

## 2022-11-08 DIAGNOSIS — R194 Change in bowel habit: Secondary | ICD-10-CM | POA: Insufficient documentation

## 2022-11-08 DIAGNOSIS — K2990 Gastroduodenitis, unspecified, without bleeding: Secondary | ICD-10-CM | POA: Insufficient documentation

## 2022-11-08 DIAGNOSIS — R1013 Epigastric pain: Secondary | ICD-10-CM | POA: Insufficient documentation

## 2022-11-08 DIAGNOSIS — Z860100 Personal history of colon polyps, unspecified: Secondary | ICD-10-CM | POA: Insufficient documentation

## 2022-11-08 DIAGNOSIS — R141 Gas pain: Secondary | ICD-10-CM | POA: Insufficient documentation

## 2022-11-08 DIAGNOSIS — Z1211 Encounter for screening for malignant neoplasm of colon: Secondary | ICD-10-CM | POA: Insufficient documentation

## 2022-11-08 MED ORDER — ALPRAZOLAM 0.5 MG PO TABS: ORAL_TABLET | ORAL | 0 refills | Status: DC

## 2022-11-08 NOTE — Progress Notes (Unsigned)
Chief Complaint  Patient presents with   New Patient (Initial Visit)    Rm 15. Accompanied by daughter. NP Paper referral for TIA, Aphasia, migraines TOC.      ASSESSMENT AND PLAN  [name] is a 72 y.o. female Right visual change, Word finding difficulty episodes  She does has multiple vascular risk factors,  Aging, hypertension, hyperlipidemia, obesity  At risk for TIA,  MRI of brain, stop aspirin, changed to Plavix 75 mg daily  Will inform her MRI of the brain report, if there is significant abnormality, will return to clinic to review findings, if there is no acute abnormality, she is to continue work with by primary care physician to address vascular risk factor, also emphasized the importance of moderate exercise, increase water intake   DIAGNOSTIC DATA (LABS, IMAGING, TESTING) - I reviewed patient records, labs, notes, testing and imaging myself where available.   MEDICAL HISTORY:  [name] is a 72 year old female, seen in request by her primary care from [facility] nurse practitioner Hassell Done, Mary-Margaret for evaluation of word finding difficulties, initial evaluation was on November 08, 2022  I reviewed and summarized the referring note. PMHX. Chronic migraine HTN HLD  Patient had long standing history of chronic migraine, often preceding by visual aura, flashing light in her visual field, involving both visual field, followed by holoacranial can be severe migraine headaches  In June 2022, she had 1 episode that is different from her typical migraine visual aura, this time she described right eye monocular visual phenomenon, diagonal, across her right visual field, lower part was fuzzy, lasting for few seconds, she also describes few episodes around that period of time, while she was talking with her husband, she has word finding difficulties  She was seen by Dr. Merlene Laughter at that time, CT head without contrast in June 2022 showed no acute  abnormality  She did have echocardiogram January 2022, normal ejection fraction, mild left ventricular hypertrophy, Ultrasound of carotid artery showed no large vessel disease  She has been taking aspirin 81 mg daily for many years when the episode was happening,  Over the past couple years, she has intermittent episode of word finding difficulties but less clearly defined as episode described above   lipid panel January 2024, LDL 116, CMP showed creatinine 0.68, CBC hemoglobin of 14.3,   PHYSICAL EXAM:   Vitals:   11/08/22 1052  BP: (!) 149/81  Pulse: 67  Weight: 234 lb (106.1 kg)  Height: 5' 7.6" (1.717 m)     Body mass index is 36 kg/m.  PHYSICAL EXAMNIATION:  Gen: NAD, conversant, well nourised, well groomed                     Cardiovascular: Regular rate rhythm, no peripheral edema, warm, nontender. Eyes: Conjunctivae clear without exudates or hemorrhage Neck: Supple, no carotid bruits. Pulmonary: Clear to auscultation bilaterally   NEUROLOGICAL EXAM:  MENTAL STATUS: Speech/cognition: Awake, alert, oriented to history taking and casual conversation CRANIAL NERVES: CN II: Visual fields are full to confrontation. Pupils are round equal and briskly reactive to light. CN III, IV, VI: extraocular movement are normal. No ptosis. CN V: Facial sensation is intact to light touch CN VII: Face is symmetric with normal eye closure  CN VIII: Hard of hearing. CN IX, X: Phonation is normal. CN XI: Head turning and shoulder shrug are intact  MOTOR: There is no pronator drift of out-stretched arms. Muscle bulk and tone are normal. Muscle strength  is normal.  REFLEXES: Reflexes are 1 and symmetric at the biceps, triceps, knees, and ankles. Plantar responses are flexor.  SENSORY: Intact to light touch, pinprick and vibratory sensation are intact in fingers and toes.  COORDINATION: There is no trunk or limb dysmetria noted.  GAIT/STANCE: Push-up to get up from seated  position, cautious  REVIEW OF SYSTEMS:  Full 14 system review of systems performed and notable only for as above All other review of systems were negative.   ALLERGIES: Allergies  Allergen Reactions   Diovan [Valsartan] Anaphylaxis    weakness weakness   Amoxicillin Hives and Other (See Comments)    itching itching   Augmentin [Amoxicillin-Pot Clavulanate] Itching and Other (See Comments)    unknown   Hctz [Hydrochlorothiazide] Other (See Comments)    weakness weakness   Zithromax [Azithromycin] Itching and Other (See Comments)    unknown   Albuterol     Other reaction(s): Lethargy (intolerance) Fatigue and depression   Cephalexin Other (See Comments)    Severe acid reflux   Diphenhydramine Hcl Other (See Comments)    Mental status changes   Fexofenadine Other (See Comments)    unknown   Penicillins Other (See Comments)    unknown unknown   Toprol Xl [Metoprolol Tartrate]     Stinging all over body   Sulfa Antibiotics Itching    itching   Sulfacetamide Sodium Itching    itching   Sulfasalazine Itching    itching    HOME MEDICATIONS: Current Outpatient Medications  Medication Sig Dispense Refill   Acetaminophen (TYLENOL PO) Take 250 mg by mouth 2 (two) times daily as needed (pain).      amLODipine (NORVASC) 5 MG tablet TAKE ONE TABLET ONCE DAILY 90 tablet 1   clopidogrel (PLAVIX) 75 MG tablet clopidogrel 75 mg tablet  TAKE ONE TABLET ONCE DAILY (STOP DAILY ASPIRIN)     doxycycline (VIBRAMYCIN) 50 MG capsule Take 1 capsule (50 mg total) by mouth daily as needed. 30 capsule 1   metoprolol tartrate (LOPRESSOR) 50 MG tablet TAKE ONE AND ONE-HALF TABLETS BY MOUTH TWICE DAILY 90 tablet 2   rosuvastatin (CRESTOR) 10 MG tablet Take 1 tablet (10 mg total) by mouth daily. 90 tablet 3   No current facility-administered medications for this visit.    PAST MEDICAL HISTORY: Past Medical History:  Diagnosis Date   Bronchitis    Essential hypertension    Exogenous  obesity    Gastroesophageal reflux disease without esophagitis    Hearing loss    Hypercholesteremia    PVC's (premature ventricular contractions)     PAST SURGICAL HISTORY: Past Surgical History:  Procedure Laterality Date   BREAST EXCISIONAL BIOPSY Right 40 years ago   benign   COLON SURGERY  07/08/11   emergency colon surgery    hysterectomy - unknown type     lap resection of perforated diverticulum  07/08/2011   drainage of intraabdominal abscess    FAMILY HISTORY: Family History  Problem Relation Age of Onset   Breast cancer Mother    Hypertension Father    Heart disease Father    Coronary artery disease Father     SOCIAL HISTORY: Social History   Socioeconomic History   Marital status: Married    Spouse name: Not on file   Number of children: Not on file   Years of education: Not on file   Highest education level: Not on file  Occupational History   Not on file  Tobacco Use   Smoking  status: Never   Smokeless tobacco: Never  Vaping Use   Vaping Use: Never used  Substance and Sexual Activity   Alcohol use: No   Drug use: No   Sexual activity: Not on file  Other Topics Concern   Not on file  Social History Narrative   Patient drinks some tea and coffee, no sodas.   Social Determinants of Health   Financial Resource Strain: Not on file  Food Insecurity: Not on file  Transportation Needs: Not on file  Physical Activity: Not on file  Stress: Not on file  Social Connections: Not on file  Intimate Partner Violence: Not on file      Marcial Pacas, M.D. Ph.D.  [facility] [address], [place], [place] [address] Ph: [phone] Fax: [phone]  CC:  [address] [place],  [place] [address]  Chevis Pretty, [place]

## 2022-11-10 ENCOUNTER — Other Ambulatory Visit: Payer: Self-pay | Admitting: Cardiology

## 2022-11-15 ENCOUNTER — Telehealth: Payer: Self-pay | Admitting: Neurology

## 2022-11-15 NOTE — Telephone Encounter (Signed)
Craig Staggers: [id number] exp. 11/15/22-12/15/22 sent to [facility] [phone]

## 2022-12-05 ENCOUNTER — Telehealth: Payer: Self-pay | Admitting: Nurse Practitioner

## 2022-12-05 NOTE — Telephone Encounter (Signed)
Patient stopped taking her Crestor due having leg issues.  She said she does not want try any other statin medication.

## 2022-12-05 NOTE — Telephone Encounter (Signed)
Contacted [name] to schedule their annual wellness visit. Patient declined to schedule AWV at this time.   Does not believe she needs to schedule this appointment due to seeing so many doctors.  Thank you,  Colletta Maryland,  [facility] Direct Dial ??[phone]

## 2022-12-05 NOTE — Telephone Encounter (Signed)
FYI

## 2022-12-12 ENCOUNTER — Ambulatory Visit (HOSPITAL_COMMUNITY)
Admission: RE | Admit: 2022-12-12 | Discharge: 2022-12-12 | Disposition: A | Discharge status: Home / Self Care | Payer: Medicare PPO | Source: Ambulatory Visit | Attending: Neurology | Admitting: Neurology

## 2022-12-12 DIAGNOSIS — G459 Transient cerebral ischemic attack, unspecified: Secondary | ICD-10-CM | POA: Diagnosis not present

## 2023-02-03 ENCOUNTER — Other Ambulatory Visit: Payer: Self-pay | Admitting: Cardiology

## 2023-02-03 ENCOUNTER — Other Ambulatory Visit: Payer: Self-pay | Admitting: Nurse Practitioner

## 2023-02-03 DIAGNOSIS — L719 Rosacea, unspecified: Secondary | ICD-10-CM

## 2023-03-04 ENCOUNTER — Other Ambulatory Visit: Payer: Self-pay | Admitting: Nurse Practitioner

## 2023-04-13 ENCOUNTER — Ambulatory Visit: Payer: Medicare PPO | Admitting: Nurse Practitioner

## 2023-04-13 ENCOUNTER — Ambulatory Visit: Payer: Medicare PPO | Attending: Cardiology | Admitting: Cardiology

## 2023-04-13 ENCOUNTER — Encounter: Payer: Self-pay | Admitting: Cardiology

## 2023-04-13 VITALS — BP 136/82 | HR 82 | Ht 67.5 in | Wt 238.0 lb

## 2023-04-13 DIAGNOSIS — E782 Mixed hyperlipidemia: Secondary | ICD-10-CM

## 2023-04-13 DIAGNOSIS — I493 Ventricular premature depolarization: Secondary | ICD-10-CM | POA: Diagnosis not present

## 2023-04-13 DIAGNOSIS — I6523 Occlusion and stenosis of bilateral carotid arteries: Secondary | ICD-10-CM

## 2023-04-13 DIAGNOSIS — T466X5A Adverse effect of antihyperlipidemic and antiarteriosclerotic drugs, initial encounter: Secondary | ICD-10-CM

## 2023-04-13 DIAGNOSIS — I10 Essential (primary) hypertension: Secondary | ICD-10-CM | POA: Diagnosis not present

## 2023-04-13 DIAGNOSIS — M791 Myalgia, unspecified site: Secondary | ICD-10-CM

## 2023-04-13 MED ORDER — ROSUVASTATIN CALCIUM 5 MG PO TABS: 5.0000 mg | ORAL_TABLET | ORAL | Status: DC

## 2023-04-13 NOTE — Progress Notes (Signed)
    Cardiology Office Note  Date: 04/13/2023   ID: [name], DOB 05-27-1951, MRN [id number]  History of Present Illness: [name] is a 71 y.o. female last seen in November 2023.  She is here with her husband for a follow-up visit.  States that overall she has been feeling well.  No palpitations or chest discomfort.  We went over her medications.  She did try Crestor 10 mg daily but had statin myalgias and had to stop the medication.  We discussed various options today.  She is in general hesitant about statins, but willing to try a very low-dose Crestor.  Also does not want to utilize any injectable agents such as PCSK9 inhibitors.  Her LDL was 116 in January.  She does have vascular disease in the form of mild carotid artery stenosis.  She continues to follow with PCP.  No other changes in her recent medications.  Physical Exam: VS:  BP 136/82   Pulse 82   Ht 5' 7.5" (1.715 m)   Wt 238 lb (108 kg)   SpO2 96%   BMI 36.73 kg/m , BMI Body mass index is 36.73 kg/m.  Wt Readings from Last 3 Encounters:  04/13/23 238 lb (108 kg)  11/08/22 234 lb (106.1 kg)  10/13/22 235 lb (106.6 kg)    General: Patient appears comfortable at rest. HEENT: Conjunctiva and lids normal. Neck: Supple, no elevated JVP or carotid bruits. Lungs: Clear to auscultation, nonlabored breathing at rest. Cardiac: Regular rate and rhythm, no S3 or significant systolic murmur.  ECG:  An ECG dated 08/24/2022 was personally reviewed today and demonstrated:  Sinus rhythm.  Labwork: 10/13/2022: ALT 16; AST 13; BUN 13; Creatinine, Ser 0.68; Hemoglobin 14.3; Platelets 274; Potassium 4.5; Sodium 142     Component Value Date/Time   CHOL 179 10/13/2022 0930   TRIG 132 10/13/2022 0930   HDL 39 (L) 10/13/2022 0930   CHOLHDL 4.6 (H) 10/13/2022 0930   CHOLHDL 4 11/27/2014 1204   VLDL 34.6 11/27/2014 1204   LDLCALC 116 (H) 10/13/2022 0930   LDLDIRECT 158.3 03/09/2011 0837   Other Studies Reviewed Today:  No  interval cardiac testing for review today.  Assessment and Plan:  1.  Essential hypertension.  Systolic is in the 130s today.  Continue Norvasc and Lopressor.  2.  History of symptomatic PVCs.  LVEF 50 to 55% by echocardiogram in January 2022.  Symptomatically stable on Lopressor.  3.  Mild carotid artery disease, less than 50% bilateral ICA stenosis by carotid Dopplers in January 2022.  4.  Mixed hyperlipidemia, LDL 116 in January of this year.  As per above discussion she will try Crestor 5 mg twice weekly.  If tolerated can reassess lipid panel down the road.  Could potentially add Zetia to that regimen depending on LDL control.  She does not want to consider any injectable agents such as PCSK9 inhibitors.  Disposition:  Follow up  6 months.  Signed, Jonelle Sidle, M.D., F.A.C.C.  [facility]

## 2023-04-13 NOTE — Patient Instructions (Signed)
Medication Instructions:   Take Crestor 5 mg at bedtime on Monday and Thursday  Labwork: None today  Testing/Procedures: None today  Follow-Up: 6 months  Any Other Special Instructions Will Be Listed Below (If Applicable).  If you need a refill on your cardiac medications before your next appointment, please call your pharmacy.

## 2023-04-17 ENCOUNTER — Ambulatory Visit: Payer: Medicare PPO | Admitting: Nurse Practitioner

## 2023-04-17 ENCOUNTER — Encounter: Payer: Self-pay | Admitting: Nurse Practitioner

## 2023-04-17 VITALS — BP 134/75 | HR 68 | Temp 97.1°F | Resp 20 | Ht 67.0 in | Wt 236.0 lb

## 2023-04-17 DIAGNOSIS — L719 Rosacea, unspecified: Secondary | ICD-10-CM

## 2023-04-17 DIAGNOSIS — K219 Gastro-esophageal reflux disease without esophagitis: Secondary | ICD-10-CM

## 2023-04-17 DIAGNOSIS — E782 Mixed hyperlipidemia: Secondary | ICD-10-CM | POA: Diagnosis not present

## 2023-04-17 DIAGNOSIS — I10 Essential (primary) hypertension: Secondary | ICD-10-CM | POA: Diagnosis not present

## 2023-04-17 DIAGNOSIS — R300 Dysuria: Secondary | ICD-10-CM | POA: Diagnosis not present

## 2023-04-17 DIAGNOSIS — B3731 Acute candidiasis of vulva and vagina: Secondary | ICD-10-CM

## 2023-04-17 LAB — URINALYSIS, COMPLETE
Bilirubin, UA: NEGATIVE
Glucose, UA: NEGATIVE
Ketones, UA: NEGATIVE
Leukocytes,UA: NEGATIVE
Nitrite, UA: NEGATIVE
Protein,UA: NEGATIVE
Specific Gravity, UA: 1.02 (ref 1.005–1.030)
Urobilinogen, Ur: 0.2 mg/dL (ref 0.2–1.0)
pH, UA: 5.5 (ref 5.0–7.5)

## 2023-04-17 LAB — MICROSCOPIC EXAMINATION
Bacteria, UA: NONE SEEN
RBC, Urine: NONE SEEN /hpf (ref 0–2)
Renal Epithel, UA: NONE SEEN /hpf

## 2023-04-17 MED ORDER — AMLODIPINE BESYLATE 5 MG PO TABS: 5.0000 mg | ORAL_TABLET | Freq: Every day | ORAL | 1 refills | Status: DC

## 2023-04-17 MED ORDER — ROSUVASTATIN CALCIUM 5 MG PO TABS: 5.0000 mg | ORAL_TABLET | ORAL | Status: DC

## 2023-04-17 MED ORDER — OMEPRAZOLE 40 MG PO CPDR: 40.0000 mg | DELAYED_RELEASE_CAPSULE | Freq: Every day | ORAL | 3 refills | Status: DC

## 2023-04-17 MED ORDER — FLUCONAZOLE 150 MG PO TABS: 150.0000 mg | ORAL_TABLET | Freq: Once | ORAL | 0 refills | Status: AC

## 2023-04-17 MED ORDER — DOXYCYCLINE HYCLATE 50 MG PO CAPS: 50.0000 mg | ORAL_CAPSULE | Freq: Every day | ORAL | 1 refills | Status: DC

## 2023-04-17 NOTE — Progress Notes (Signed)
Subjective:    Patient ID: [name], female    DOB: 02/02/1951, 72 y.o.   MRN: [id number]   Chief Complaint: medical management of chronic issues     HPI:  [name] is a 72 y.o. who identifies as a female who was assigned female at birth.   Social history: Lives with: husband Work history: retired   Water engineer in today for follow up of the following chronic medical issues:  1. Primary hypertension No c/o chest pain, sob or headache. Does not check blood pressure at home. BP Readings from Last 3 Encounters:  04/13/23 136/82  11/08/22 (!) 149/81  10/13/22 (!) 165/97     2. Gastroesophageal reflux disease without esophagitis Is on omeprazole and is doing well. Needs meds cause she does not have any.  3. Hypercholesteremia Does tru to watch diet but does no dedicated exercise. Crestor causes myalgia so she is trying to take just 2 days a week,. She refuses to try injectable meds. Lab Results  Component Value Date   CHOL 179 10/13/2022   HDL 39 (L) 10/13/2022   LDLCALC 116 (H) 10/13/2022   LDLDIRECT 158.3 03/09/2011   TRIG 132 10/13/2022   CHOLHDL 4.6 (H) 10/13/2022    Saw cardiology on 04/13/23. Suggested possibly adding zetia to meds depending on LDL levels today  New complaints: Patient says she urinates constantly. Has slight lower back pain. Slight dysuria. Thinks could be yeast infection cause she is on doxycycline daily for roacea.  Allergies  Allergen Reactions   Diovan [Valsartan] Anaphylaxis    weakness weakness   Amoxicillin Hives and Other (See Comments)    itching itching   Augmentin [Amoxicillin-Pot Clavulanate] Itching and Other (See Comments)    unknown   Hctz [Hydrochlorothiazide] Other (See Comments)    weakness weakness   Zithromax [Azithromycin] Itching and Other (See Comments)    unknown   Albuterol     Other reaction(s): Lethargy (intolerance) Fatigue and depression   Cephalexin Other (See Comments)    Severe acid reflux    Diphenhydramine Hcl Other (See Comments)    Mental status changes   Fexofenadine Other (See Comments)    unknown   Penicillins Other (See Comments)    unknown unknown   Toprol Xl [Metoprolol Tartrate]     Stinging all over body   Sulfa Antibiotics Itching    itching   Sulfacetamide Sodium Itching    itching   Sulfasalazine Itching    itching   Outpatient Encounter Medications as of 04/17/2023  Medication Sig   Acetaminophen (TYLENOL PO) Take 250 mg by mouth 2 (two) times daily as needed (pain).    amLODipine (NORVASC) 5 MG tablet TAKE ONE TABLET ONCE DAILY   clopidogrel (PLAVIX) 75 MG tablet TAKE ONE TABLET ONCE DAILY   doxycycline (VIBRAMYCIN) 50 MG capsule TAKE 1 CAPSULE DAILY AS NEEDED   metoprolol tartrate (LOPRESSOR) 50 MG tablet TAKE 1 AND 1/2 TABLETS BY MOUTH TWICE DAILY (Patient taking differently: Take 50 mg by mouth 2 (two) times daily.)   rosuvastatin (CRESTOR) 5 MG tablet Take 1 tablet (5 mg total) by mouth 2 (two) times a week.   No facility-administered encounter medications on file as of 04/17/2023.    Past Surgical History:  Procedure Laterality Date   BREAST EXCISIONAL BIOPSY Right 40 years ago   benign   COLON SURGERY  07/08/11   emergency colon surgery    hysterectomy - unknown type     lap resection of perforated diverticulum  07/08/2011   drainage of intraabdominal abscess    Family History  Problem Relation Age of Onset   Breast cancer Mother    Hypertension Father    Heart disease Father    Coronary artery disease Father       Controlled substance contract: n/a     Review of Systems  Constitutional:  Negative for diaphoresis.  Eyes:  Negative for pain.  Respiratory:  Negative for shortness of breath.   Cardiovascular:  Negative for chest pain, palpitations and leg swelling.  Gastrointestinal:  Negative for abdominal pain.  Endocrine: Negative for polydipsia.  Genitourinary:  Positive for frequency and urgency.  Skin:  Negative for  rash.  Neurological:  Negative for dizziness, weakness and headaches.  Hematological:  Does not bruise/bleed easily.  All other systems reviewed and are negative.      Objective:   Physical Exam Vitals and nursing note reviewed.  Constitutional:      General: She is not in acute distress.    Appearance: Normal appearance. She is well-developed.  HENT:     Head: Normocephalic.     Right Ear: Tympanic membrane normal.     Left Ear: Tympanic membrane normal.     Nose: Nose normal.     Mouth/Throat:     Mouth: Mucous membranes are moist.  Eyes:     Pupils: Pupils are equal, round, and reactive to light.  Neck:     Vascular: No carotid bruit or JVD.  Cardiovascular:     Rate and Rhythm: Normal rate and regular rhythm.     Heart sounds: Normal heart sounds.  Pulmonary:     Effort: Pulmonary effort is normal. No respiratory distress.     Breath sounds: Normal breath sounds. No wheezing or rales.  Chest:     Chest wall: No tenderness.  Abdominal:     General: Bowel sounds are normal. There is no distension or abdominal bruit.     Palpations: Abdomen is soft. There is no hepatomegaly, splenomegaly, mass or pulsatile mass.     Tenderness: There is no abdominal tenderness.  Musculoskeletal:        General: Normal range of motion.     Cervical back: Normal range of motion and neck supple.  Lymphadenopathy:     Cervical: No cervical adenopathy.  Skin:    General: Skin is warm and dry.  Neurological:     Mental Status: She is alert and oriented to person, place, and time.     Deep Tendon Reflexes: Reflexes are normal and symmetric.  Psychiatric:        Behavior: Behavior normal.        Thought Content: Thought content normal.        Judgment: Judgment normal.     BP 134/75   Pulse 68   Temp (!) 97.1 F (36.2 C) (Temporal)   Resp 20   Ht 5\' 7"  (1.702 m)   Wt 236 lb (107 kg)   SpO2 97%   BMI 36.96 kg/m   Urine clear     Assessment & Plan:   [name] comes in  today with chief complaint of Medical Management of Chronic Issues   Diagnosis and orders addressed:  1. Primary hypertension Low sodium diet - CBC with Differential/Platelet - CMP14+EGFR - amLODipine (NORVASC) 5 MG tablet; Take 1 tablet (5 mg total) by mouth daily.  Dispense: 90 tablet; Refill: 1  2. Gastroesophageal reflux disease without esophagitis Avoid spicy foods Do not eat 2 hours  prior to bedtime - omeprazole (PRILOSEC) 40 MG capsule; Take 1 capsule (40 mg total) by mouth daily.  Dispense: 30 capsule; Refill: 3  3. Mixed hyperlipidemia Low fat diet - Lipid panel - rosuvastatin (CRESTOR) 5 MG tablet; Take 1 tablet (5 mg total) by mouth 2 (two) times a week.  4. Dysuria Force fluids - Urinalysis, Complete - Urine Culture  5. Vaginal candidiasis - fluconazole (DIFLUCAN) 150 MG tablet; Take 1 tablet (150 mg total) by mouth once for 1 dose.  Dispense: 1 tablet; Refill: 0  6. Rosacea - doxycycline (VIBRAMYCIN) 50 MG capsule; Take 1 capsule (50 mg total) by mouth daily.  Dispense: 90 capsule; Refill: 1   Labs pending Health Maintenance reviewed Diet and exercise encouraged  Follow up plan: 6 months   Mary-Margaret Daphine Deutscher, FNP

## 2023-04-19 LAB — URINE CULTURE

## 2023-05-09 ENCOUNTER — Other Ambulatory Visit: Payer: Self-pay | Admitting: Cardiology

## 2023-07-10 ENCOUNTER — Other Ambulatory Visit: Payer: Self-pay | Admitting: Cardiology

## 2023-09-08 ENCOUNTER — Telehealth: Payer: Self-pay | Admitting: Nurse Practitioner

## 2023-10-20 ENCOUNTER — Ambulatory Visit: Payer: Medicare PPO | Admitting: Nurse Practitioner

## 2023-12-18 ENCOUNTER — Encounter: Payer: Self-pay | Admitting: Nurse Practitioner

## 2023-12-18 ENCOUNTER — Ambulatory Visit: Payer: Medicare PPO | Admitting: Nurse Practitioner

## 2023-12-18 VITALS — BP 141/72 | HR 69 | Temp 97.9°F | Ht 67.0 in | Wt 238.0 lb

## 2023-12-18 DIAGNOSIS — Z8673 Personal history of transient ischemic attack (TIA), and cerebral infarction without residual deficits: Secondary | ICD-10-CM | POA: Insufficient documentation

## 2023-12-18 DIAGNOSIS — I10 Essential (primary) hypertension: Secondary | ICD-10-CM | POA: Diagnosis not present

## 2023-12-18 DIAGNOSIS — K219 Gastro-esophageal reflux disease without esophagitis: Secondary | ICD-10-CM

## 2023-12-18 DIAGNOSIS — E782 Mixed hyperlipidemia: Secondary | ICD-10-CM

## 2023-12-18 DIAGNOSIS — Z6837 Body mass index (BMI) 37.0-37.9, adult: Secondary | ICD-10-CM | POA: Diagnosis not present

## 2023-12-18 LAB — LIPID PANEL

## 2023-12-18 MED ORDER — OMEPRAZOLE 40 MG PO CPDR: 40.0000 mg | DELAYED_RELEASE_CAPSULE | Freq: Every day | ORAL | 1 refills | Status: DC

## 2023-12-18 MED ORDER — CLOPIDOGREL BISULFATE 75 MG PO TABS: 75.0000 mg | ORAL_TABLET | Freq: Every day | ORAL | 12 refills | Status: DC

## 2023-12-18 MED ORDER — METOPROLOL TARTRATE 50 MG PO TABS: 75.0000 mg | ORAL_TABLET | Freq: Two times a day (BID) | ORAL | 6 refills | Status: DC

## 2023-12-18 MED ORDER — AMLODIPINE BESYLATE 5 MG PO TABS: 5.0000 mg | ORAL_TABLET | Freq: Every day | ORAL | 1 refills | Status: DC

## 2023-12-18 NOTE — Patient Instructions (Signed)

## 2023-12-18 NOTE — Progress Notes (Signed)
 Subjective:    Patient ID: [name], female    DOB: [birth year], 73 y.o.   MRN: [id number]   Chief Complaint: medical management of chronic issues     HPI:  [name] is a 73 y.o. who identifies as a female who was assigned female at birth.   Social history: Lives with: husband Work history: retired   Water engineer in today for follow up of the following chronic medical issues:  1. Primary hypertension No c/o chest pain, sob or headache. Does not check blood pressure at home. BP Readings from Last 3 Encounters:  12/18/23 (!) 141/72  04/17/23 134/75  04/13/23 136/82     2. Gastroesophageal reflux disease without esophagitis Is on omeprazole and is doing well. Needs meds cause she does not have any.  3. Hypercholesteremia Does tru to watch diet but does no dedicated exercise. Crestor causes myalgia so she is trying to take just 2 days a week,. She refuses to try injectable meds. Lab Results  Component Value Date   CHOL 179 10/13/2022   HDL 39 (L) 10/13/2022   LDLCALC 116 (H) 10/13/2022   LDLDIRECT 158.3 03/09/2011   TRIG 132 10/13/2022   CHOLHDL 4.6 (H) 10/13/2022   The 10-year ASCVD risk score (Arnett DK, et al., 2019) is: 20.5%  4. BMI 37.0-37.9 Wt Readings from Last 3 Encounters:  12/18/23 238 lb (108 kg)  04/17/23 236 lb (107 kg)  04/13/23 238 lb (108 kg)   BMI Readings from Last 3 Encounters:  12/18/23 37.28 kg/m  04/17/23 36.96 kg/m  04/13/23 36.73 kg/m      New complaints: None today  Allergies  Allergen Reactions   Diovan [Valsartan] Anaphylaxis    weakness weakness   Amoxicillin Hives and Other (See Comments)    itching itching   Augmentin [Amoxicillin-Pot Clavulanate] Itching and Other (See Comments)    unknown   Hctz [Hydrochlorothiazide] Other (See Comments)    weakness weakness   Zithromax [Azithromycin] Itching and Other (See Comments)    unknown   Albuterol     Other reaction(s): Lethargy (intolerance) Fatigue and  depression   Cephalexin Other (See Comments)    Severe acid reflux   Diphenhydramine Hcl Other (See Comments)    Mental status changes   Fexofenadine Other (See Comments)    unknown   Penicillins Other (See Comments)    unknown unknown   Toprol Xl [Metoprolol Tartrate]     Stinging all over body   Sulfa Antibiotics Itching    itching   Sulfacetamide Sodium Itching    itching   Sulfasalazine Itching    itching   Outpatient Encounter Medications as of 12/18/2023  Medication Sig   Acetaminophen (TYLENOL PO) Take 250 mg by mouth 2 (two) times daily as needed (pain).    amLODipine (NORVASC) 5 MG tablet Take 1 tablet (5 mg total) by mouth daily.   clopidogrel (PLAVIX) 75 MG tablet TAKE ONE TABLET ONCE DAILY   metoprolol tartrate (LOPRESSOR) 50 MG tablet TAKE 1 AND 1/2 TABLETS BY MOUTH TWICE DAILY   omeprazole (PRILOSEC) 40 MG capsule Take 1 capsule (40 mg total) by mouth daily.   [DISCONTINUED] doxycycline (VIBRAMYCIN) 50 MG capsule Take 1 capsule (50 mg total) by mouth daily.   [DISCONTINUED] rosuvastatin (CRESTOR) 5 MG tablet Take 1 tablet (5 mg total) by mouth 2 (two) times a week.   No facility-administered encounter medications on file as of 12/18/2023.    Past Surgical History:  Procedure Laterality Date  BREAST EXCISIONAL BIOPSY Right 40 years ago   benign   COLON SURGERY  07/08/11   emergency colon surgery    hysterectomy - unknown type     lap resection of perforated diverticulum  07/08/2011   drainage of intraabdominal abscess    Family History  Problem Relation Age of Onset   Breast cancer Mother    Hypertension Father    Heart disease Father    Coronary artery disease Father       Controlled substance contract: n/a     Review of Systems  Constitutional:  Negative for diaphoresis.  Eyes:  Negative for pain.  Respiratory:  Negative for shortness of breath.   Cardiovascular:  Negative for chest pain, palpitations and leg swelling.  Gastrointestinal:   Negative for abdominal pain.  Endocrine: Negative for polydipsia.  Genitourinary:  Positive for frequency and urgency.  Skin:  Negative for rash.  Neurological:  Negative for dizziness, weakness and headaches.  Hematological:  Does not bruise/bleed easily.  All other systems reviewed and are negative.      Objective:   Physical Exam Vitals and nursing note reviewed.  Constitutional:      General: She is not in acute distress.    Appearance: Normal appearance. She is well-developed.  HENT:     Head: Normocephalic.     Right Ear: Tympanic membrane normal.     Left Ear: Tympanic membrane normal.     Nose: Nose normal.     Mouth/Throat:     Mouth: Mucous membranes are moist.  Eyes:     Pupils: Pupils are equal, round, and reactive to light.  Neck:     Vascular: No carotid bruit or JVD.  Cardiovascular:     Rate and Rhythm: Normal rate and regular rhythm.     Heart sounds: Normal heart sounds.  Pulmonary:     Effort: Pulmonary effort is normal. No respiratory distress.     Breath sounds: Normal breath sounds. No wheezing or rales.  Chest:     Chest wall: No tenderness.  Abdominal:     General: Bowel sounds are normal. There is no distension or abdominal bruit.     Palpations: Abdomen is soft. There is no hepatomegaly, splenomegaly, mass or pulsatile mass.     Tenderness: There is no abdominal tenderness.  Musculoskeletal:        General: Normal range of motion.     Cervical back: Normal range of motion and neck supple.  Lymphadenopathy:     Cervical: No cervical adenopathy.  Skin:    General: Skin is warm and dry.  Neurological:     Mental Status: She is alert and oriented to person, place, and time.     Deep Tendon Reflexes: Reflexes are normal and symmetric.  Psychiatric:        Behavior: Behavior normal.        Thought Content: Thought content normal.        Judgment: Judgment normal.     BP (!) 141/72   Pulse 69   Temp 97.9 F (36.6 C) (Temporal)   Ht 5\' 7"   (1.702 m)   Wt 238 lb (108 kg)   SpO2 96%   BMI 37.28 kg/m        Assessment & Plan:   [name] comes in today with chief complaint of Medical Management of Chronic Issues   Diagnosis and orders addressed:  1. Primary hypertension Low sodium diet - CBC with Differential/Platelet - CMP14+EGFR - amLODipine (NORVASC)  5 MG tablet; Take 1 tablet (5 mg total) by mouth daily.  Dispense: 90 tablet; Refill: 1  2. Gastroesophageal reflux disease without esophagitis Avoid spicy foods Do not eat 2 hours prior to bedtime - omeprazole (PRILOSEC) 40 MG capsule; Take 1 capsule (40 mg total) by mouth daily.  Dispense: 30 capsule; Refill: 3  3. Mixed hyperlipidemia Low fat diet - Lipid panel - rosuvastatin (CRESTOR) 5 MG tablet; Take 1 tablet (5 mg total) by mouth 2 (two) times a week.  4. BMI 37.0-37.9 Discussed diet and exercise for person with BMI >25 Will recheck weight in 3-6 months  5. History CVA Continue plavix as prescribed Report any bleeding issues  Labs pending Health Maintenance reviewed Diet and exercise encouraged  Follow up plan: 6 months   Mary-Margaret Daphine Deutscher, FNP

## 2023-12-18 NOTE — Addendum Note (Signed)
 Addended by: Cleda Daub on: 12/18/2023 10:28 AM   Modules accepted: Orders

## 2023-12-18 NOTE — Addendum Note (Signed)
 Addended by: Cleda Daub on: 12/18/2023 10:26 AM   Modules accepted: Orders

## 2023-12-19 LAB — CMP14+EGFR
ALT: 24 IU/L (ref 0–32)
AST: 19 IU/L (ref 0–40)
Albumin: 4.2 g/dL (ref 3.8–4.8)
Alkaline Phosphatase: 75 IU/L (ref 44–121)
BUN/Creatinine Ratio: 23 (ref 12–28)
BUN: 15 mg/dL (ref 8–27)
Bilirubin Total: 0.8 mg/dL (ref 0.0–1.2)
CO2: 20 mmol/L (ref 20–29)
Calcium: 9.2 mg/dL (ref 8.7–10.3)
Chloride: 105 mmol/L (ref 96–106)
Creatinine, Ser: 0.65 mg/dL (ref 0.57–1.00)
Globulin, Total: 2.5 g/dL (ref 1.5–4.5)
Glucose: 104 mg/dL — ABNORMAL HIGH (ref 70–99)
Potassium: 4.6 mmol/L (ref 3.5–5.2)
Sodium: 141 mmol/L (ref 134–144)
Total Protein: 6.7 g/dL (ref 6.0–8.5)
eGFR: 93 mL/min/{1.73_m2} (ref >59)

## 2023-12-19 LAB — CBC WITH DIFFERENTIAL/PLATELET
Basophils Absolute: 0.1 10*3/uL (ref 0.0–0.2)
Basos: 1 %
EOS (ABSOLUTE): 0.3 10*3/uL (ref 0.0–0.4)
Eos: 3 %
Hematocrit: 44.1 % (ref 34.0–46.6)
Hemoglobin: 14 g/dL (ref 11.1–15.9)
Immature Grans (Abs): 0 10*3/uL (ref 0.0–0.1)
Immature Granulocytes: 0 %
Lymphocytes Absolute: 1.9 10*3/uL (ref 0.7–3.1)
Lymphs: 22 %
MCH: 28.3 pg (ref 26.6–33.0)
MCHC: 31.7 g/dL (ref 31.5–35.7)
MCV: 89 fL (ref 79–97)
Monocytes Absolute: 0.5 10*3/uL (ref 0.1–0.9)
Monocytes: 6 %
Neutrophils Absolute: 6 10*3/uL (ref 1.4–7.0)
Neutrophils: 68 %
Platelets: 272 10*3/uL (ref 150–450)
RBC: 4.94 x10E6/uL (ref 3.77–5.28)
RDW: 12.3 % (ref 11.7–15.4)
WBC: 8.8 10*3/uL (ref 3.4–10.8)

## 2023-12-19 LAB — LIPID PANEL
Cholesterol, Total: 177 mg/dL (ref 100–199)
HDL: 40 mg/dL (ref >39)
LDL CALC COMMENT:: 4.4 ratio (ref 0.0–4.4)
LDL Chol Calc (NIH): 111 mg/dL — ABNORMAL HIGH (ref 0–99)
Triglycerides: 149 mg/dL (ref 0–149)
VLDL Cholesterol Cal: 26 mg/dL (ref 5–40)

## 2024-01-08 ENCOUNTER — Ambulatory Visit: Payer: Medicare PPO | Admitting: Cardiology

## 2024-01-09 ENCOUNTER — Ambulatory Visit: Payer: Medicare PPO | Attending: Cardiology | Admitting: Cardiology

## 2024-01-09 ENCOUNTER — Encounter: Payer: Self-pay | Admitting: Cardiology

## 2024-01-09 VITALS — BP 138/82 | HR 67 | Ht 67.5 in | Wt 239.2 lb

## 2024-01-09 DIAGNOSIS — I6523 Occlusion and stenosis of bilateral carotid arteries: Secondary | ICD-10-CM | POA: Diagnosis not present

## 2024-01-09 DIAGNOSIS — T466X5D Adverse effect of antihyperlipidemic and antiarteriosclerotic drugs, subsequent encounter: Secondary | ICD-10-CM

## 2024-01-09 DIAGNOSIS — I493 Ventricular premature depolarization: Secondary | ICD-10-CM

## 2024-01-09 DIAGNOSIS — E782 Mixed hyperlipidemia: Secondary | ICD-10-CM

## 2024-01-09 DIAGNOSIS — I10 Essential (primary) hypertension: Secondary | ICD-10-CM

## 2024-01-09 DIAGNOSIS — T466X5A Adverse effect of antihyperlipidemic and antiarteriosclerotic drugs, initial encounter: Secondary | ICD-10-CM

## 2024-01-09 DIAGNOSIS — M791 Myalgia, unspecified site: Secondary | ICD-10-CM | POA: Diagnosis not present

## 2024-01-09 NOTE — Progress Notes (Signed)
    Cardiology Office Note  Date: 01/09/2024   ID: [name], DOB 12/26/1950, MRN [id number]  History of Present Illness: [name] is a 73 y.o. female last seen in July 2024.  She is here with her husband for a follow-up visit.  States that she has been feeling reasonably well.  Plans to focus more on diet and exercise as management for her lipids.  She did not tolerate even low-dose Crestor due to myalgias and is not interested in other medication options.  She continues to follow regularly with PCP.  I reviewed her recent lab work.  We went over her medications today.  She reports compliance with her medications.  ECG today shows sinus rhythm with increased voltage.  Physical Exam: VS:  BP 138/82 (BP Location: Right Arm)   Pulse 67   Ht 5' 7.5" (1.715 m)   Wt 239 lb 3.2 oz (108.5 kg)   SpO2 95%   BMI 36.91 kg/m , BMI Body mass index is 36.91 kg/m.  Wt Readings from Last 3 Encounters:  01/09/24 239 lb 3.2 oz (108.5 kg)  12/18/23 238 lb (108 kg)  04/17/23 236 lb (107 kg)    General: Patient appears comfortable at rest. HEENT: Conjunctiva and lids normal. Neck: Supple, no elevated JVP or carotid bruits. Lungs: Clear to auscultation, nonlabored breathing at rest. Cardiac: Regular rate and rhythm, no S3 or significant systolic murmur. Extremities: No pitting edema.  ECG:  An ECG dated 08/24/2022 was personally reviewed today and demonstrated:  Sinus rhythm.  Labwork: 12/18/2023: ALT 24; AST 19; BUN 15; Creatinine, Ser 0.65; Hemoglobin 14.0; Platelets 272; Potassium 4.6; Sodium 141     Component Value Date/Time   CHOL 177 12/18/2023 1028   TRIG 149 12/18/2023 1028   HDL 40 12/18/2023 1028   CHOLHDL 4.4 12/18/2023 1028   CHOLHDL 4 11/27/2014 1204   VLDL 34.6 11/27/2014 1204   LDLCALC 111 (H) 12/18/2023 1028   LDLDIRECT 158.3 03/09/2011 0837   Other Studies Reviewed Today:  No interval cardiac testing for review today.  Assessment and Plan:  1.  Primary  hypertension.  Continue Norvasc 5 mg daily and Lopressor 75 mg twice daily.  Also discussed diet and walking plan for exercise.   2.  History of symptomatic PVCs.  LVEF 50 to 55% by echocardiogram in January 2022.  She is asymptomatic.  Heart rate regular today.   3.  Mild carotid artery disease, less than 50% bilateral ICA stenosis by carotid Dopplers in January 2022.  Plan to follow-up carotid Dopplers in January of next year.   4.  Mixed hyperlipidemia, LDL 111 in March.  She has a history of statin myalgias and per discussion she is not interested in other medication options at this point.  She will focus on diet and exercise for now.  Disposition:  Follow up  January 2026.  Signed, Gerard Knight, M.D., F.A.C.C. [facility]

## 2024-01-09 NOTE — Patient Instructions (Signed)
 Medication Instructions:  Your physician recommends that you continue on your current medications as directed. Please refer to the Current Medication list given to you today.   Labwork: None today  Testing/Procedures: Your physician has requested that you have a carotid duplex in January 2026. This test is an ultrasound of the carotid arteries in your neck. It looks at blood flow through these arteries that supply the brain with blood. Allow one hour for this exam. There are no restrictions or special instructions.   Follow-Up: In January after carotid ultrasound with Dr.McDowell   Any Other Special Instructions Will Be Listed Below (If Applicable).  If you need a refill on your cardiac medications before your next appointment, please call your pharmacy.

## 2024-01-18 ENCOUNTER — Encounter: Payer: Self-pay | Admitting: Nurse Practitioner

## 2024-01-18 ENCOUNTER — Ambulatory Visit (INDEPENDENT_AMBULATORY_CARE_PROVIDER_SITE_OTHER)

## 2024-01-18 ENCOUNTER — Ambulatory Visit: Admitting: Nurse Practitioner

## 2024-01-18 VITALS — BP 136/68 | HR 71 | Temp 97.1°F | Ht 67.0 in | Wt 236.0 lb

## 2024-01-18 DIAGNOSIS — R079 Chest pain, unspecified: Secondary | ICD-10-CM

## 2024-01-18 MED ORDER — PREDNISONE 20 MG PO TABS: 40.0000 mg | ORAL_TABLET | Freq: Every day | ORAL | 0 refills | Status: AC

## 2024-01-18 NOTE — Patient Instructions (Signed)
Pleurisy  Pleurisy is when the lining of your lungs (pleura) swells and gets irritated. This can cause pain in your chest, back, or shoulder. You may also have trouble breathing. What are the causes? A lung infection. A blood clot that goes into your lungs. Injury to the chest. Heart or chest surgery. Certain medicines or treatment for cancer. Certain diseases, such as lung cancer. In some cases, the cause is not known. What are the signs or symptoms? Chest pain that may: Be on one side of your body. Be sharp and stabbing. Get worse when you cough or breathe deep. Trouble breathing. Noisy breathing. Cough. Fever. Coughing up blood. Your symptoms may get worse when you lie down or lie on your side. How is this treated? You may need: Medicines to treat swelling, pain, or a cough. Antibiotics. Blood thinners. You may need these if you have a blood clot. To have the fluid or air taken out of your lungs with a tube. Follow these instructions at home: Medicines Take over-the-counter and prescription medicines only as told by your doctor. If you were prescribed antibiotics, take them as told by your doctor. Do not stop using them even if you start to feel better. If you were prescribed medicines to remove fluid from your lungs (diuretics), take them as told by your doctor. If you are taking blood thinners: Talk with your doctor before taking any medicines that have aspirin or NSAIDs, such as ibuprofen. Take medicines exactly as told. Take them at the same time each day. Do not do things that could hurt or bruise you. Be careful to avoid falls. Wear an alert bracelet or carry a card that says you take blood thinners. Ask your doctor if you should avoid driving or using machines while you are taking your medicine. Activity Rest as told by your doctor. Return to your normal activities when your doctor says that it is safe. General instructions Watch for any changes in how you  feel. Take deep breaths often, even if it hurts. This can help prevent lung problems. You may be given a machine called an incentive spirometer. This can help you breathe better. Do not smoke or use any products that contain nicotine or tobacco. If you need help quitting, ask your doctor. Contact a doctor if: You have pain that gets worse or happens more often. Your pain does not get better with medicine. You have a fever or chills. Your cough or trouble breathing does not get better. You cough up mucus or thick spit (phlegm) that looks like pus. You cough up blood. Get help right away if: You have noisy breathing or more trouble breathing. You have pain that spreads into your neck, arms, or jaw. You feel faint or dizzy. These symptoms may be an emergency. Get help right away. Call 911. Do not wait to see if the symptoms will go away. Do not drive yourself to the hospital. This information is not intended to replace advice given to you by your health care provider. Make sure you discuss any questions you have with your health care provider. Document Revised: 04/22/2022 Document Reviewed: 04/22/2022 Elsevier Patient Education  2024 [employer].

## 2024-01-18 NOTE — Progress Notes (Signed)
   Subjective:    Patient ID: [name], female    DOB: 1951-07-08, 73 y.o.   MRN: [id number]   Chief Complaint: Left underarm pain (Thinks it may be pleurisy. )   HPI  Patient comes in c/o left axillary pain. Pain is intermittent. Worse with taking deep breath or movement of arm in a certain way. Rates pain 4/10 when she has pain.  She thinks she has pleurisy. Has been coughing for several weeks.   Patient Active Problem List   Diagnosis Date Noted   BMI 37.0-37.9, adult 12/18/2023   History of CVA in adulthood 12/18/2023   History of colonic polyps 11/08/2022   TIA (transient ischemic attack) 11/08/2022   Gastroesophageal reflux disease without esophagitis 11/24/2015   Hearing loss 11/24/2015   Other nonrheumatic mitral valve disorders 11/24/2015   PVC's (premature ventricular contractions)    HTN (hypertension)    Hyperlipidemia        Review of Systems  Constitutional:  Negative for chills and fever.  HENT:  Negative for congestion and rhinorrhea.   Respiratory:  Positive for cough. Negative for shortness of breath, wheezing and stridor.        Objective:   Physical Exam Constitutional:      Appearance: Normal appearance. She is obese.  Cardiovascular:     Rate and Rhythm: Normal rate and regular rhythm.     Heart sounds: Normal heart sounds.  Pulmonary:     Effort: Pulmonary effort is normal.     Breath sounds: Normal breath sounds.  Skin:    General: Skin is warm.  Neurological:     General: No focal deficit present.     Mental Status: She is alert and oriented to person, place, and time.  Psychiatric:        Mood and Affect: Mood normal.        Behavior: Behavior normal.     BP 136/68   Pulse 71   Temp (!) 97.1 F (36.2 C) (Temporal)   Ht 5\' 7"  (1.702 m)   Wt 236 lb (107 kg)   SpO2 95%   BMI 36.96 kg/m   Chest xray- clear-Preliminary reading by Irvine Mantis, FNP  [facility]      Assessment & Plan:   [name] in today with chief  complaint of Left underarm pain (Thinks it may be pleurisy. )   1. Right-sided chest pain (Primary) Moist heat OTC cough meds  Meds ordered this encounter  Medications   predniSONE  (DELTASONE ) 20 MG tablet    Sig: Take 2 tablets (40 mg total) by mouth daily with breakfast for 5 days. 2 po daily for 5 days    Dispense:  10 tablet    Refill:  0    Supervising Provider:   Jolyne Needs A [[id number]]    - DG Chest 2 View - EKG 12-Lead    The above assessment and management plan was discussed with the patient. The patient verbalized understanding of and has agreed to the management plan. Patient is aware to call the clinic if symptoms persist or worsen. Patient is aware when to return to the clinic for a follow-up visit. Patient educated on when it is appropriate to go to the emergency department.   Mary-Margaret Gaylyn Keas, FNP

## 2024-01-30 ENCOUNTER — Other Ambulatory Visit: Payer: Self-pay

## 2024-03-04 ENCOUNTER — Other Ambulatory Visit: Payer: Self-pay | Admitting: Nurse Practitioner

## 2024-03-04 DIAGNOSIS — L719 Rosacea, unspecified: Secondary | ICD-10-CM

## 2024-03-04 NOTE — Telephone Encounter (Signed)
 Copied from CRM [id number]. Topic: Clinical - Medication Refill >> Mar 04, 2024 11:14 AM Elle L wrote: Medication: Doxycycline  Hyclate 50 MG TBEC   Has the patient contacted their pharmacy? Yes  This is the patient's preferred pharmacy:  [facility] [place], [place] - [address] [place] [address] Phone: [phone] Fax: [phone]  Is this the correct pharmacy for this prescription? Yes  Has the prescription been filled recently? No  Is the patient out of the medication? No, 3 pills left.   Has the patient been seen for an appointment in the last year OR does the patient have an upcoming appointment? Yes  Can we respond through MyChart? Yes  Agent: Please be advised that Rx refills may take up to 3 business days. We ask that you follow-up with your pharmacy.

## 2024-03-07 ENCOUNTER — Ambulatory Visit: Payer: Self-pay

## 2024-03-07 ENCOUNTER — Other Ambulatory Visit: Payer: Self-pay

## 2024-03-07 ENCOUNTER — Ambulatory Visit: Payer: Self-pay | Admitting: Nurse Practitioner

## 2024-03-07 MED ORDER — DOXYCYCLINE HYCLATE 50 MG PO TBEC: 50.0000 mg | DELAYED_RELEASE_TABLET | ORAL | 0 refills | Status: DC | PRN

## 2024-03-07 NOTE — Addendum Note (Signed)
 Addended by: Sofia Jaquith, MARY-MARGARET on: 03/07/2024 02:09 PM   Modules accepted: Orders

## 2024-03-07 NOTE — Telephone Encounter (Signed)
 FYI Only or Action Required?: Action required by provider  Patient was last seen in primary care on 01/18/2024 by Delfina Feller, FNP. Called Nurse Triage reporting Advice Only.   Triage Disposition: Call PCP When Office is Open  Patient/caregiver understands and will follow disposition?: Yes                       Copied from CRM [id number]. Topic: Clinical - Pink Word Triage >> Mar 07, 2024  8:18 AM Elle L wrote: Reason for Triage: The patient has rosacea from being without her Doxycycline  Hyclate 50 MG TBEC. She denied pain or swelling but states that it will spread to her eyes. The medication was previously denied as she needs an appointment. However, her Pharmacist recommended her speaking to Nurse Triage. The patient's call back number is [phone]. Reason for Disposition  [1] Caller requesting NON-URGENT health information AND [2] PCP's office is the best resource  Answer Assessment - Initial Assessment Questions 1. REASON FOR CALL or QUESTION: What is your reason for calling today? or How can I best help you? or What question do you have that I can help answer?     Patient called in to ask about the doxycyline she requested earlier. This RN let patient know it was sent in. Patient states she usually takes doxycyline almost every day and wants to know if she needs to schedule another appointment for refills or if her PCP will send refills.  Protocols used: Information Only Call - No Triage-A-AH

## 2024-03-07 NOTE — Telephone Encounter (Signed)
 FYI Only or Action Required?: Action required by provider  Patient was last seen in primary care on 01/18/2024 by Delfina Feller, FNP. Called Nurse Triage reporting Medication Request. Symptoms began patient did not specify. Interventions attempted: Other: patient needing Doxycycline  refill for rosacea. Symptoms are: gradually worsening.  Triage Disposition: Call PCP When Office is Open  Patient/caregiver understands and will follow disposition?: No, wishes to speak with PCP       Reason for Disposition  [1] Caller requesting NON-URGENT health information AND [2] PCP's office is the best resource  Answer Assessment - Initial Assessment Questions 1. REASON FOR CALL or QUESTION: What is your reason for calling today? or How can I best help you? or What question do you have that I can help answer?     Patient called in stating I know what is going on. I have an ongoing condition that is rosacea of my face and eyes that I have used Doxycycline  for for a while. It is the only thing that works. I was told I have to have an appt before I get a refill but I do have an upcoming appt in September. Would my doctor get me enough to last me until September? If I need to come in, please call me back so I can schedule an appt  This RN advised patient this message would be passed to doctor and she will be called back by office staff if necessary.  Protocols used: Information Only Call - No Triage-A-AH

## 2024-03-07 NOTE — Telephone Encounter (Signed)
 It looks like doxy is on pt med list as historical. Ok to refill or does pt ntbs?

## 2024-03-18 ENCOUNTER — Ambulatory Visit: Admitting: Nurse Practitioner

## 2024-03-25 ENCOUNTER — Telehealth: Payer: Self-pay

## 2024-03-25 ENCOUNTER — Ambulatory Visit: Admitting: Nurse Practitioner

## 2024-03-25 ENCOUNTER — Encounter: Payer: Self-pay | Admitting: Nurse Practitioner

## 2024-03-25 VITALS — BP 160/88 | HR 70 | Temp 97.6°F | Ht 67.0 in | Wt 236.0 lb

## 2024-03-25 DIAGNOSIS — I10 Essential (primary) hypertension: Secondary | ICD-10-CM | POA: Diagnosis not present

## 2024-03-25 DIAGNOSIS — L719 Rosacea, unspecified: Secondary | ICD-10-CM | POA: Diagnosis not present

## 2024-03-25 MED ORDER — DOXYCYCLINE HYCLATE 50 MG PO CAPS
50.0000 mg | ORAL_CAPSULE | Freq: Two times a day (BID) | ORAL | 2 refills | Status: DC
Start: 2024-03-25 — End: 2024-06-20

## 2024-03-25 MED ORDER — DOXYCYCLINE HYCLATE 50 MG PO TBEC: 50.0000 mg | DELAYED_RELEASE_TABLET | ORAL | 2 refills | Status: DC | PRN

## 2024-03-25 NOTE — Patient Instructions (Signed)

## 2024-03-25 NOTE — Telephone Encounter (Signed)
 Copied from CRM [id number]. Topic: Clinical - Prescription Issue >> Mar 25, 2024  9:36 AM Zane F wrote: Reason for CRM:   Caller: Josh  Calling From: [employer]  Reason: Calling in for clarification for the patient's Doxycycline  Hyclate 50 MG TBEC for delayed release and the patient has been prescribed the immediate release for this medication for many years and they are looking to clarify if the delayed release was sent in by mistake.  Contact Information:  Phone:  [facility] [place], [place] - [address] [place], [place] [address] Phone: [phone]  Fax: [phone]

## 2024-03-25 NOTE — Addendum Note (Signed)
 Addended by: GLADIS MUSTARD on: 03/25/2024 10:53 AM   Modules accepted: Orders

## 2024-03-25 NOTE — Progress Notes (Signed)
   Subjective:    Patient ID: [name], female    DOB: 1951-05-02, 73 y.o.   MRN: [id number]   Chief Complaint: rosacea   HPI  Patient comes in for follow up of her rosacea. She says she has tried everything over the years and the only thing that works is a round of doxycycline . She usually takes doxycycline  1 tablet daily and she I about to run out.  Patient Active Problem List   Diagnosis Date Noted   BMI 37.0-37.9, adult 12/18/2023   History of CVA in adulthood 12/18/2023   History of colonic polyps 11/08/2022   TIA (transient ischemic attack) 11/08/2022   Gastroesophageal reflux disease without esophagitis 11/24/2015   Hearing loss 11/24/2015   Other nonrheumatic mitral valve disorders 11/24/2015   PVC's (premature ventricular contractions)    HTN (hypertension)    Hyperlipidemia      Review of Systems  Constitutional:  Negative for diaphoresis.  Eyes:  Negative for pain.  Respiratory:  Negative for shortness of breath.   Cardiovascular:  Negative for chest pain, palpitations and leg swelling.  Gastrointestinal:  Negative for abdominal pain.  Endocrine: Negative for polydipsia.  Skin:  Negative for rash.  Neurological:  Negative for dizziness, weakness and headaches.  Hematological:  Does not bruise/bleed easily.  All other systems reviewed and are negative.      Objective:   Physical Exam Constitutional:      Appearance: Normal appearance. She is obese.   Cardiovascular:     Rate and Rhythm: Normal rate and regular rhythm.     Heart sounds: Normal heart sounds.  Pulmonary:     Breath sounds: Normal breath sounds.   Skin:    General: Skin is warm.   Neurological:     General: No focal deficit present.     Mental Status: She is alert and oriented to person, place, and time.   Psychiatric:        Mood and Affect: Mood normal.        Behavior: Behavior normal.    BP (!) 160/88   Pulse 70   Temp 97.6 F (36.4 C) (Temporal)   Ht 5' 7 (1.702 m)    Wt 236 lb (107 kg)   SpO2 94%   BMI 36.96 kg/m          Assessment & Plan:   [name] in today with chief complaint of Rosacea   1. Rosacea (Primary) Continue creams Wash face daily - Doxycycline  Hyclate 50 MG TBEC; Take 50 mg by mouth as needed.  Dispense: 30 tablet; Refill: 2  2. Primary hypertension Check blood pressure at home- let me know if stay abpve 140 systolic Dash diet RTO prn    The above assessment and management plan was discussed with the patient. The patient verbalized understanding of and has agreed to the management plan. Patient is aware to call the clinic if symptoms persist or worsen. Patient is aware when to return to the clinic for a follow-up visit. Patient educated on when it is appropriate to go to the emergency department.   Mary-Margaret Gladis, FNP

## 2024-03-25 NOTE — Telephone Encounter (Signed)
 Ok for immediate release?

## 2024-06-20 ENCOUNTER — Ambulatory Visit: Admitting: Nurse Practitioner

## 2024-06-20 ENCOUNTER — Encounter: Payer: Self-pay | Admitting: Nurse Practitioner

## 2024-06-20 VITALS — BP 133/67 | HR 70 | Temp 97.6°F | Ht 67.0 in | Wt 238.0 lb

## 2024-06-20 DIAGNOSIS — Z8673 Personal history of transient ischemic attack (TIA), and cerebral infarction without residual deficits: Secondary | ICD-10-CM

## 2024-06-20 DIAGNOSIS — K219 Gastro-esophageal reflux disease without esophagitis: Secondary | ICD-10-CM | POA: Diagnosis not present

## 2024-06-20 DIAGNOSIS — E782 Mixed hyperlipidemia: Secondary | ICD-10-CM | POA: Diagnosis not present

## 2024-06-20 DIAGNOSIS — I10 Essential (primary) hypertension: Secondary | ICD-10-CM | POA: Diagnosis not present

## 2024-06-20 DIAGNOSIS — L247 Irritant contact dermatitis due to plants, except food: Secondary | ICD-10-CM

## 2024-06-20 MED ORDER — METOPROLOL TARTRATE 50 MG PO TABS
75.0000 mg | ORAL_TABLET | Freq: Two times a day (BID) | ORAL | 1 refills | Status: AC
Start: 2024-06-20 — End: ?

## 2024-06-20 MED ORDER — DOXYCYCLINE HYCLATE 50 MG PO CAPS: 50.0000 mg | ORAL_CAPSULE | Freq: Two times a day (BID) | ORAL | 2 refills | Status: AC

## 2024-06-20 MED ORDER — PREDNISONE 20 MG PO TABS: 40.0000 mg | ORAL_TABLET | Freq: Every day | ORAL | 0 refills | Status: AC

## 2024-06-20 MED ORDER — AMLODIPINE BESYLATE 5 MG PO TABS: 5.0000 mg | ORAL_TABLET | Freq: Every day | ORAL | 1 refills | Status: AC

## 2024-06-20 MED ORDER — OMEPRAZOLE 40 MG PO CPDR: 40.0000 mg | DELAYED_RELEASE_CAPSULE | Freq: Every day | ORAL | 1 refills | Status: AC

## 2024-06-20 MED ORDER — CLOPIDOGREL BISULFATE 75 MG PO TABS: 75.0000 mg | ORAL_TABLET | Freq: Every day | ORAL | 1 refills | Status: AC

## 2024-06-20 NOTE — Progress Notes (Addendum)
 Subjective:    Patient ID: [name], female    DOB: 1951-06-15, 73 y.o.   MRN: [id number]   Chief Complaint: medical management of chronic issues     HPI:  [name] is a 73 y.o. who identifies as a female who was assigned female at birth.   Social history: Lives with: husband Work history: retired   Water engineer in today for follow up of the following chronic medical issues:  1. Primary hypertension No c/o chest pain, sob or headache. Does not check blood pressure at home. BP Readings from Last 3 Encounters:  03/25/24 (!) 160/88  01/18/24 136/68  01/09/24 138/82     2. Gastroesophageal reflux disease without esophagitis Is on omeprazole  and is doing well. Needs meds cause she does not have any.  3. Hypercholesteremia Does tru to watch diet but does no dedicated exercise. Crestor  causes myalgia so she is trying to take just 2 days a week,. She refuses to try injectable meds. Lab Results  Component Value Date   CHOL 177 12/18/2023   HDL 40 12/18/2023   LDLCALC 111 (H) 12/18/2023   LDLDIRECT 158.3 03/09/2011   TRIG 149 12/18/2023   CHOLHDL 4.4 12/18/2023   The 10-year ASCVD risk score (Arnett DK, et al., 2019) is: 25.6%  4. BMI 37.0-37.9  Weight unchanged Wt Readings from Last 3 Encounters:  06/20/24 238 lb (108 kg)  03/25/24 236 lb (107 kg)  01/18/24 236 lb (107 kg)   BMI Readings from Last 3 Encounters:  06/20/24 37.28 kg/m  03/25/24 36.96 kg/m  01/18/24 36.96 kg/m       New complaints: Poison oak on bil forearms. Worse on right forearm then left  Allergies  Allergen Reactions   Diovan [Valsartan] Anaphylaxis    weakness weakness   Amoxicillin Hives and Other (See Comments)    itching itching   Augmentin [Amoxicillin-Pot Clavulanate] Itching and Other (See Comments)    unknown   Hctz [Hydrochlorothiazide] Other (See Comments)    weakness weakness   Zithromax [Azithromycin] Itching and Other (See Comments)    unknown   Albuterol      Other reaction(s): Lethargy (intolerance) Fatigue and depression   Cephalexin Other (See Comments)    Severe acid reflux   Diphenhydramine Hcl Other (See Comments)    Mental status changes   Fexofenadine Other (See Comments)    unknown   Penicillins Other (See Comments)    unknown unknown   Toprol  Xl [Metoprolol  Tartrate]     Stinging all over body   Sulfa Antibiotics Itching    itching   Sulfacetamide Sodium Itching    itching   Sulfasalazine Itching    itching   Outpatient Encounter Medications as of 06/20/2024  Medication Sig   Acetaminophen (TYLENOL PO) Take 250 mg by mouth 2 (two) times daily as needed (pain).    amLODipine  (NORVASC ) 5 MG tablet Take 1 tablet (5 mg total) by mouth daily.   clopidogrel  (PLAVIX ) 75 MG tablet Take 1 tablet (75 mg total) by mouth daily.   doxycycline  (VIBRAMYCIN ) 50 MG capsule Take 1 capsule (50 mg total) by mouth 2 (two) times daily.   metoprolol  tartrate (LOPRESSOR ) 50 MG tablet Take 1.5 tablets (75 mg total) by mouth 2 (two) times daily.   omeprazole  (PRILOSEC) 40 MG capsule Take 1 capsule (40 mg total) by mouth daily.   No facility-administered encounter medications on file as of 06/20/2024.    Past Surgical History:  Procedure Laterality Date   BREAST  EXCISIONAL BIOPSY Right 40 years ago   benign   COLON SURGERY  07/08/11   emergency colon surgery    hysterectomy - unknown type     lap resection of perforated diverticulum  07/08/2011   drainage of intraabdominal abscess    Family History  Problem Relation Age of Onset   Breast cancer Mother    Hypertension Father    Heart disease Father    Coronary artery disease Father       Controlled substance contract: n/a     Review of Systems  Constitutional:  Negative for diaphoresis.  Eyes:  Negative for pain.  Respiratory:  Negative for shortness of breath.   Cardiovascular:  Negative for chest pain, palpitations and leg swelling.  Gastrointestinal:  Negative for abdominal  pain.  Endocrine: Negative for polydipsia.  Genitourinary:  Positive for frequency and urgency.  Skin:  Negative for rash.  Neurological:  Negative for dizziness, weakness and headaches.  Hematological:  Does not bruise/bleed easily.  All other systems reviewed and are negative.      Objective:   Physical Exam Vitals and nursing note reviewed.  Constitutional:      General: She is not in acute distress.    Appearance: Normal appearance. She is well-developed.  HENT:     Head: Normocephalic.     Right Ear: Tympanic membrane normal.     Left Ear: Tympanic membrane normal.     Nose: Nose normal.     Mouth/Throat:     Mouth: Mucous membranes are moist.  Eyes:     Pupils: Pupils are equal, round, and reactive to light.  Neck:     Vascular: No carotid bruit or JVD.  Cardiovascular:     Rate and Rhythm: Normal rate and regular rhythm.     Heart sounds: Normal heart sounds.  Pulmonary:     Effort: Pulmonary effort is normal. No respiratory distress.     Breath sounds: Normal breath sounds. No wheezing or rales.  Chest:     Chest wall: No tenderness.  Abdominal:     General: Bowel sounds are normal. There is no distension or abdominal bruit.     Palpations: Abdomen is soft. There is no hepatomegaly, splenomegaly, mass or pulsatile mass.     Tenderness: There is no abdominal tenderness.  Musculoskeletal:        General: Normal range of motion.     Cervical back: Normal range of motion and neck supple.  Lymphadenopathy:     Cervical: No cervical adenopathy.  Skin:    General: Skin is warm and dry.  Neurological:     Mental Status: She is alert and oriented to person, place, and time.     Deep Tendon Reflexes: Reflexes are normal and symmetric.  Psychiatric:        Behavior: Behavior normal.        Thought Content: Thought content normal.        Judgment: Judgment normal.     BP 133/67   Pulse 70   Temp 97.6 F (36.4 C) (Temporal)   Ht 5' 7 (1.702 m)   Wt 238 lb (108  kg)   SpO2 98%   BMI 37.28 kg/m         Assessment & Plan:  LORRIANE DEHART comes in today with chief complaint of Medical Management of Chronic Issues   Diagnosis and orders addressed:  1. Primary hypertension (Primary) Low sodium diet - amLODipine  (NORVASC ) 5 MG tablet; Take 1 tablet (5  mg total) by mouth daily.  Dispense: 90 tablet; Refill: 1 - metoprolol  tartrate (LOPRESSOR ) 50 MG tablet; Take 1.5 tablets (75 mg total) by mouth 2 (two) times daily.  Dispense: 135 tablet; Refill: 1 - CBC with Differential/Platelet - CMP14+EGFR - Lipid panel  2. Mixed hyperlipidemia Low fat diet  3. Gastroesophageal reflux disease without esophagitis Avoid spicy foods Do not eat 2 hours prior to bedtime - omeprazole  (PRILOSEC) 40 MG capsule; Take 1 capsule (40 mg total) by mouth daily.  Dispense: 90 capsule; Refill: 1  4. Morbid obesity (HCC) Discussed diet and exercise for person with BMI >25 Will recheck weight in 3-6 months   5. History of CVA in adulthood - clopidogrel  (PLAVIX ) 75 MG tablet; Take 1 tablet (75 mg total) by mouth daily.  Dispense: 90 tablet; Refill: 1  6. Irritant contact dermatitis due to plants, except food Avoid scratching Cool compresses RTO prn - predniSONE  (DELTASONE ) 20 MG tablet; Take 2 tablets (40 mg total) by mouth daily with breakfast for 5 days. 2 po daily for 5 days  Dispense: 10 tablet; Refill: 0   Labs pending Health Maintenance reviewed Diet and exercise encouraged  Follow up plan: 6 months   Mary-Margaret Gladis, FNP

## 2024-06-20 NOTE — Patient Instructions (Signed)
 Fall Prevention in the Home, Adult Falls can cause injuries and can happen to people of all ages. There are many things you can do to make your home safer and to help prevent falls. What actions can I take to prevent falls? General information Use good lighting in all rooms. Make sure to: Replace any light bulbs that burn out. Turn on the lights in dark areas and use night-lights. Keep items that you use often in easy-to-reach places. Lower the shelves around your home if needed. Move furniture so that there are clear paths around it. Do not use throw rugs or other things on the floor that can make you trip. If any of your floors are uneven, fix them. Add color or contrast paint or tape to clearly mark and help you see: Grab bars or handrails. First and last steps of staircases. Where the edge of each step is. If you use a ladder or stepladder: Make sure that it is fully opened. Do not climb a closed ladder. Make sure the sides of the ladder are locked in place. Have someone hold the ladder while you use it. Know where your pets are as you move through your home. What can I do in the bathroom?     Keep the floor dry. Clean up any water on the floor right away. Remove soap buildup in the bathtub or shower. Buildup makes bathtubs and showers slippery. Use non-skid mats or decals on the floor of the bathtub or shower. Attach bath mats securely with double-sided, non-slip rug tape. If you need to sit down in the shower, use a non-slip stool. Install grab bars by the toilet and in the bathtub and shower. Do not use towel bars as grab bars. What can I do in the bedroom? Make sure that you have a light by your bed that is easy to reach. Do not use any sheets or blankets on your bed that hang to the floor. Have a firm chair or bench with side arms that you can use for support when you get dressed. What can I do in the kitchen? Clean up any spills right away. If you need to reach something  above you, use a step stool with a grab bar. Keep electrical cords out of the way. Do not use floor polish or wax that makes floors slippery. What can I do with my stairs? Do not leave anything on the stairs. Make sure that you have a light switch at the top and the bottom of the stairs. Make sure that there are handrails on both sides of the stairs. Fix handrails that are broken or loose. Install non-slip stair treads on all your stairs if they do not have carpet. Avoid having throw rugs at the top or bottom of the stairs. Choose a carpet that does not hide the edge of the steps on the stairs. Make sure that the carpet is firmly attached to the stairs. Fix carpet that is loose or worn. What can I do on the outside of my home? Use bright outdoor lighting. Fix the edges of walkways and driveways and fix any cracks. Clear paths of anything that can make you trip, such as tools or rocks. Add color or contrast paint or tape to clearly mark and help you see anything that might make you trip as you walk through a door, such as a raised step or threshold. Trim any bushes or trees on paths to your home. Check to see if handrails are loose  or broken and that both sides of all steps have handrails. Install guardrails along the edges of any raised decks and porches. Have leaves, snow, or ice cleared regularly. Use sand, salt, or ice melter on paths if you live where there is ice and snow during the winter. Clean up any spills in your garage right away. This includes grease or oil spills. What other actions can I take? Review your medicines with your doctor. Some medicines can cause dizziness or changes in blood pressure, which increase your risk of falling. Wear shoes that: Have a low heel. Do not wear high heels. Have rubber bottoms and are closed at the toe. Feel good on your feet and fit well. Use tools that help you move around if needed. These include: Canes. Walkers. Scooters. Crutches. Ask  your doctor what else you can do to help prevent falls. This may include seeing a physical therapist to learn to do exercises to move better and get stronger. Where to find more information Centers for Disease Control and Prevention, [employer]: TonerPromos.no [employer] on Aging: BaseRingTones.pl National Institute on Aging: BaseRingTones.pl Contact a doctor if: You are afraid of falling at home. You feel weak, drowsy, or dizzy at home. You fall at home. Get help right away if you: Lose consciousness or have trouble moving after a fall. Have a fall that causes a head injury. These symptoms may be an emergency. Get help right away. Call 911. Do not wait to see if the symptoms will go away. Do not drive yourself to the hospital. This information is not intended to replace advice given to you by your health care provider. Make sure you discuss any questions you have with your health care provider. Document Revised: 05/16/2022 Document Reviewed: 05/16/2022 Elsevier Patient Education  2024 [employer].

## 2024-12-17 ENCOUNTER — Ambulatory Visit: Payer: Self-pay | Admitting: Nurse Practitioner
